# Patient Record
Sex: Male | Born: 1980 | Race: Black or African American | Hispanic: No | Marital: Single | Smoking: Former smoker
Health system: Southern US, Community
[De-identification: ages and names within clinical notes are randomized; demographics above are authoritative.]

## PROBLEM LIST (undated history)

## (undated) DIAGNOSIS — M5136 Other intervertebral disc degeneration, lumbar region: Secondary | ICD-10-CM

## (undated) DIAGNOSIS — M5137 Other intervertebral disc degeneration, lumbosacral region: Secondary | ICD-10-CM

## (undated) DIAGNOSIS — M25519 Pain in unspecified shoulder: Secondary | ICD-10-CM

## (undated) DIAGNOSIS — S63591A Other specified sprain of right wrist, initial encounter: Secondary | ICD-10-CM

## (undated) DIAGNOSIS — M51379 Other intervertebral disc degeneration, lumbosacral region without mention of lumbar back pain or lower extremity pain: Secondary | ICD-10-CM

## (undated) DIAGNOSIS — K219 Gastro-esophageal reflux disease without esophagitis: Secondary | ICD-10-CM

## (undated) DIAGNOSIS — M51369 Other intervertebral disc degeneration, lumbar region without mention of lumbar back pain or lower extremity pain: Secondary | ICD-10-CM

## (undated) DIAGNOSIS — M5126 Other intervertebral disc displacement, lumbar region: Secondary | ICD-10-CM

## (undated) DIAGNOSIS — M549 Dorsalgia, unspecified: Secondary | ICD-10-CM

## (undated) HISTORY — PX: NO PAST SURGERIES: SHX2092

---

## 2016-04-21 ENCOUNTER — Encounter (HOSPITAL_COMMUNITY): Payer: Self-pay | Admitting: Emergency Medicine

## 2016-04-21 DIAGNOSIS — Z5321 Procedure and treatment not carried out due to patient leaving prior to being seen by health care provider: Secondary | ICD-10-CM | POA: Diagnosis not present

## 2016-04-21 DIAGNOSIS — N281 Cyst of kidney, acquired: Secondary | ICD-10-CM | POA: Insufficient documentation
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
## 2016-04-22 ENCOUNTER — Emergency Department (HOSPITAL_COMMUNITY)
Admission: EM | Admit: 2016-04-22 | Discharge: 2016-04-22 | Disposition: A | Discharge status: Home / Self Care | Payer: Managed Care, Other (non HMO) | Attending: Dermatology | Admitting: Dermatology

## 2016-04-22 HISTORY — DX: Other intervertebral disc degeneration, lumbar region without mention of lumbar back pain or lower extremity pain: M51.369

## 2016-04-22 HISTORY — DX: Other intervertebral disc degeneration, lumbar region: M51.36

## 2016-04-22 HISTORY — DX: Pain in unspecified shoulder: M25.519

## 2016-04-22 HISTORY — DX: Other intervertebral disc displacement, lumbar region: M51.26

## 2016-04-22 NOTE — ED Notes (Signed)
Pt called for vitals, no answer x2 

## 2016-04-22 NOTE — ED Notes (Signed)
Pt called for room with no answer. 

## 2016-04-23 ENCOUNTER — Emergency Department (HOSPITAL_COMMUNITY)
Admission: EM | Admit: 2016-04-23 | Discharge: 2016-04-24 | Disposition: A | Discharge status: Home / Self Care | Payer: No Typology Code available for payment source | Attending: Emergency Medicine | Admitting: Emergency Medicine

## 2016-04-23 ENCOUNTER — Encounter (HOSPITAL_COMMUNITY): Payer: Self-pay | Admitting: Emergency Medicine

## 2016-04-23 ENCOUNTER — Emergency Department (HOSPITAL_COMMUNITY): Payer: No Typology Code available for payment source

## 2016-04-23 DIAGNOSIS — R0789 Other chest pain: Secondary | ICD-10-CM | POA: Diagnosis present

## 2016-04-23 LAB — CBC
HEMATOCRIT: 43.2 % (ref 39.0–52.0)
HEMOGLOBIN: 14.6 g/dL (ref 13.0–17.0)
MCH: 29.6 pg (ref 26.0–34.0)
MCHC: 33.8 g/dL (ref 30.0–36.0)
MCV: 87.4 fL (ref 78.0–100.0)
Platelets: 158 10*3/uL (ref 150–400)
RBC: 4.94 MIL/uL (ref 4.22–5.81)
RDW: 13.4 % (ref 11.5–15.5)
WBC: 7.1 10*3/uL (ref 4.0–10.5)

## 2016-04-23 LAB — BASIC METABOLIC PANEL
ANION GAP: 6 (ref 5–15)
BUN: 11 mg/dL (ref 6–20)
CALCIUM: 9.4 mg/dL (ref 8.9–10.3)
CO2: 27 mmol/L (ref 22–32)
Chloride: 106 mmol/L (ref 101–111)
Creatinine, Ser: 1.08 mg/dL (ref 0.61–1.24)
GFR calc non Af Amer: >60 mL/min (ref >60)
GLUCOSE: 90 mg/dL (ref 65–99)
POTASSIUM: 3.8 mmol/L (ref 3.5–5.1)
Sodium: 139 mmol/L (ref 135–145)

## 2016-04-23 LAB — I-STAT TROPONIN, ED: TROPONIN I, POC: 0 ng/mL (ref 0.00–0.08)

## 2016-04-23 NOTE — ED Triage Notes (Signed)
Pt from home with complaints of left chest pain. Pt states he was seen at an UC earlier today for the same. Pt states he drank soda and his symptoms became worse. Pt has clear lung sounds and normal heart sounds. Pt has adequate cap refill and strong radial pulses. Pt denies radiation to any other body part. Pt is not diaphoretic or lightheaded.

## 2016-04-24 MED ORDER — IBUPROFEN 800 MG PO TABS: 800.0000 mg | ORAL_TABLET | Freq: Three times a day (TID) | ORAL | 0 refills | Status: DC

## 2016-04-24 NOTE — ED Provider Notes (Signed)
[facility]-EMERGENCY DEPT Provider Note   CSN: [id number] Arrival date & time: 04/23/16  2206  By signing my name below, I, Majel Homer, attest that this documentation has been prepared under the direction and in the presence of Pricilla Loveless, MD . Electronically Signed: Majel Homer, Scribe. 04/24/2016. 12:24 AM.  History   Chief Complaint Chief Complaint  Patient presents with  . Chest Pain   The history is provided by the patient. No language interpreter was used.   HPI Comments: Chad Nichols is a 35 y.o. male who presents to the Emergency Department complaining of gradually worsening, intermittent, "sharp," central chest pain described as tightness that began yesterday morning. Pt reports his pain lasts for 15-20 minutes during episodes; he denies pain now in the ED. He states his pain is exacerbated when breathing deeply. He notes he has not taken any medication to relieve his pain. Pt denies shortness of breath, diaphoresis, nausea, vomiting, leg swelling and hemataemesis. He states he was recently prescribed hydrocodone and lyrica for a bulging lumbar disc. Pt reports he has a follow-up appointment with his PCP next week.   PCP: [facility]  Past Medical History:  Diagnosis Date  . Bulging lumbar disc   . Shoulder pain    There are no active problems to display for this patient.  History reviewed. No pertinent surgical history.  Home Medications    Prior to Admission medications   Medication Sig Start Date End Date Taking? Authorizing Provider  ibuprofen (ADVIL,MOTRIN) 800 MG tablet Take 1 tablet (800 mg total) by mouth 3 (three) times daily. 04/24/16   Pricilla Loveless, MD    Family History No family history on file.  Social History Social History  Substance Use Topics  . Smoking status: Never Smoker  . Smokeless tobacco: Never Used  . Alcohol use Yes   Allergies   Review of patient's allergies indicates no known allergies.   Review of  Systems Review of Systems  Constitutional: Negative for diaphoresis.  Respiratory: Positive for chest tightness. Negative for shortness of breath.   Cardiovascular: Positive for chest pain. Negative for leg swelling.  Gastrointestinal: Negative for nausea and vomiting.   Physical Exam Updated Vital Signs BP 117/84 (BP Location: Right Arm)   Pulse 64   Temp 98.3 F (36.8 C) (Oral)   Resp 16   Ht 6\' 8"  (2.032 m)   Wt 247 lb (112 kg)   SpO2 98%   BMI 27.13 kg/m   Physical Exam  Constitutional: He is oriented to person, place, and time. He appears well-developed and well-nourished.  HENT:  Head: Normocephalic and atraumatic.  Right Ear: External ear normal.  Left Ear: External ear normal.  Nose: Nose normal.  Eyes: Right eye exhibits no discharge. Left eye exhibits no discharge.  Neck: Neck supple.  Cardiovascular: Normal rate, regular rhythm and normal heart sounds.   Pulmonary/Chest: Effort normal and breath sounds normal. He exhibits tenderness (mild tenderness just left of sternum).  Abdominal: Soft. There is no tenderness.  Musculoskeletal: He exhibits no edema.  Neurological: He is alert and oriented to person, place, and time.  Skin: Skin is warm and dry.  Nursing note and vitals reviewed.  ED Treatments / Results  Labs (all labs ordered are listed, but only abnormal results are displayed) Labs Reviewed  BASIC METABOLIC PANEL  CBC  I-STAT TROPOININ, ED    EKG  EKG Interpretation  Date/Time:  Thursday April 23 2016 22:14:22 EDT Ventricular Rate:  63 PR Interval:  QRS Duration: 96 QT Interval:  382 QTC Calculation: 391 R Axis:   36 Text Interpretation:  Sinus rhythm Borderline low voltage, extremity leads RSR' in V1 or V2, probably normal variant ST elevation suggests acute pericarditis no reciprocal changes No old tracing to compare Confirmed by Shakedra Beam MD, Necha Harries [phone]([id number]) on 04/23/2016 11:54:04 PM       Radiology Dg Chest 2 View  Result Date:  04/23/2016 CLINICAL DATA:  Upper left chest pain radiating to the left shoulder since this morning. Nonsmoker. EXAM: CHEST  2 VIEW COMPARISON:  None. FINDINGS: Mild hyperinflation. The heart size and mediastinal contours are within normal limits. Both lungs are clear. The visualized skeletal structures are unremarkable. IMPRESSION: No active cardiopulmonary disease. Electronically Signed   By: Burman NievesWilliam  Stevens M.D.   On: 04/23/2016 23:23    Procedures Procedures (including critical care time)  Medications Ordered in ED Medications - No data to display  DIAGNOSTIC STUDIES:  Oxygen Saturation is 98% on RA, normal by my interpretation.    COORDINATION OF CARE:  12:22 AM Discussed treatment plan with pt at bedside and pt agreed to plan.  Initial Impression / Assessment and Plan / ED Course  I have reviewed the triage vital signs and the nursing notes.  Pertinent labs & imaging results that were available during my care of the patient were reviewed by me and considered in my medical decision making (see chart for details).  Clinical Course    Patient's pain seems MSK vs mild pericarditis. CXR benign. Labs, troponin negative after 48 hours of symptoms. Highly doubt PE, no risk factors, no tachycardia, hypoxia. Ibuprofen x 1 week, f/u with PCP.  I personally performed the services described in this documentation, which was scribed in my presence. The recorded information has been reviewed and is accurate.   Final Clinical Impressions(s) / ED Diagnoses   Final diagnoses:  Chest wall pain    New Prescriptions New Prescriptions   IBUPROFEN (ADVIL,MOTRIN) 800 MG TABLET    Take 1 tablet (800 mg total) by mouth 3 (three) times daily.     Pricilla LovelessScott Arihant Pennings, MD 04/24/16 [phone]

## 2016-10-03 ENCOUNTER — Emergency Department (HOSPITAL_COMMUNITY)
Admission: EM | Admit: 2016-10-03 | Discharge: 2016-10-03 | Disposition: A | Discharge status: Home / Self Care | Payer: Managed Care, Other (non HMO) | Attending: Emergency Medicine | Admitting: Emergency Medicine

## 2016-10-03 ENCOUNTER — Encounter (HOSPITAL_COMMUNITY): Payer: Self-pay | Admitting: Emergency Medicine

## 2016-10-03 DIAGNOSIS — R220 Localized swelling, mass and lump, head: Secondary | ICD-10-CM | POA: Diagnosis present

## 2016-10-03 DIAGNOSIS — M270 Developmental disorders of jaws: Secondary | ICD-10-CM | POA: Diagnosis not present

## 2016-10-03 DIAGNOSIS — Z79899 Other long term (current) drug therapy: Secondary | ICD-10-CM | POA: Diagnosis not present

## 2016-10-03 MED ORDER — IBUPROFEN 800 MG PO TABS
800.0000 mg | ORAL_TABLET | Freq: Once | ORAL | Status: AC
  Administered 2016-10-03: 800 mg via ORAL
  Filled 2016-10-03: qty 1

## 2016-10-03 NOTE — ED Triage Notes (Signed)
Per pt, states he noticed his upper palate swollen-went to dentist and was given mouth wash-states it has not helped-hard to touch, some discomfort with palpation

## 2016-10-03 NOTE — ED Provider Notes (Signed)
[facility]-EMERGENCY DEPT Provider Note   CSN: [id number] Arrival date & time: 10/03/16  1738  By signing my name below, I, Sonum Patel, attest that this documentation has been prepared under the direction and in the presence of [employer], PA-C. Electronically Signed: Sonum Patel, Neurosurgeon. 10/03/16. 6:42 PM.  History   Chief Complaint Chief Complaint  Patient presents with  . dental issue    The history is provided by the patient. No language interpreter was used.     HPI Comments: Chad Nichols is a 36 y.o. male who presents to the Emergency Department complaining of an area of swelling with associated hardness and mild discomfort to the hard palate that he noticed over the last week. He was seen by his dentist for a filling and was given a mouthwash to use for these symptoms but states it was not helpful. He denies any modifying factors. He denies any other complaints at this time.   Past Medical History:  Diagnosis Date  . Bulging lumbar disc   . Shoulder pain     There are no active problems to display for this patient.   History reviewed. No pertinent surgical history.     Home Medications    Prior to Admission medications   Medication Sig Start Date End Date Taking? Authorizing Provider  ibuprofen (ADVIL,MOTRIN) 800 MG tablet Take 1 tablet (800 mg total) by mouth 3 (three) times daily. 04/24/16   Pricilla Loveless, MD    Family History No family history on file.  Social History Social History  Substance Use Topics  . Smoking status: Never Smoker  . Smokeless tobacco: Never Used  . Alcohol use Yes     Allergies   Patient has no known allergies.   Review of Systems Review of Systems  Constitutional: Negative for fever.  HENT: Negative for dental problem.        +swelling and discomfort to hard palate     Physical Exam Updated Vital Signs BP 140/90   Pulse 79   Temp 98.3 F (36.8 C) (Oral)   Resp 18   SpO2 100%   Physical Exam    Constitutional: He is oriented to person, place, and time. He appears well-developed and well-nourished.  HENT:  Head: Normocephalic and atraumatic.  Cardiovascular: Normal rate.   Pulmonary/Chest: Effort normal.  Neurological: He is alert and oriented to person, place, and time.  Skin: Skin is warm and dry.  Psychiatric: He has a normal mood and affect.  Nursing note and vitals reviewed.    ED Treatments / Results  DIAGNOSTIC STUDIES: Oxygen Saturation is 100% on RA, normal by my interpretation.    COORDINATION OF CARE: 6:34 PM Discussed treatment plan with pt at bedside and pt agreed to plan.   Labs (all labs ordered are listed, but only abnormal results are displayed) Labs Reviewed - No data to display  EKG  EKG Interpretation None       Radiology No results found.  Procedures Procedures (including critical care time)  Medications Ordered in ED Medications - No data to display   Initial Impression / Assessment and Plan / ED Course  I have reviewed the triage vital signs and the nursing notes.  Pertinent labs & imaging results that were available during my care of the patient were reviewed by me and considered in my medical decision making (see chart for details).      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  Patient only most recently noticing this.  This appears like a classical presentation, uniformly midline with no other abnormalities noted.  Patient is artery been seen by his dentist for this twice, encouraged him to follow-up with dentist or oral surgeon for repeat evaluation to confirm that this diagnosis is correct and this is not any other concerning finding.  Patient verbalized he will follow-up as indicated.  No further questions or concerns at time of discharge  New Prescriptions New Prescriptions   No medications on file   I personally  performed the services described in this documentation, which was scribed in my presence. The recorded information has been reviewed and is accurate.    Eyvonne MechanicJeffrey Kandise Riehle, PA-C 10/03/16 1842    Charlynne Panderavid Hsienta Yao, MD 10/03/16 [phone]

## 2016-10-03 NOTE — Discharge Instructions (Signed)
Your presentation today is most consistent with torus palatinus.  I encourage you to follow up with your dentist or any oral surgeon for repeat evaluation and management of this to rule out any significant other causes.

## 2018-09-15 ENCOUNTER — Other Ambulatory Visit: Payer: Self-pay

## 2018-09-15 ENCOUNTER — Emergency Department (HOSPITAL_BASED_OUTPATIENT_CLINIC_OR_DEPARTMENT_OTHER): Payer: No Typology Code available for payment source

## 2018-09-15 ENCOUNTER — Emergency Department (HOSPITAL_BASED_OUTPATIENT_CLINIC_OR_DEPARTMENT_OTHER)
Admission: EM | Admit: 2018-09-15 | Discharge: 2018-09-15 | Disposition: A | Discharge status: Home / Self Care | Payer: No Typology Code available for payment source | Attending: Emergency Medicine | Admitting: Emergency Medicine

## 2018-09-15 ENCOUNTER — Encounter (HOSPITAL_BASED_OUTPATIENT_CLINIC_OR_DEPARTMENT_OTHER): Payer: Self-pay

## 2018-09-15 DIAGNOSIS — Z79899 Other long term (current) drug therapy: Secondary | ICD-10-CM | POA: Diagnosis not present

## 2018-09-15 DIAGNOSIS — R51 Headache: Secondary | ICD-10-CM | POA: Diagnosis present

## 2018-09-15 DIAGNOSIS — G44209 Tension-type headache, unspecified, not intractable: Secondary | ICD-10-CM | POA: Insufficient documentation

## 2018-09-15 DIAGNOSIS — M545 Low back pain, unspecified: Secondary | ICD-10-CM

## 2018-09-15 DIAGNOSIS — Z87891 Personal history of nicotine dependence: Secondary | ICD-10-CM | POA: Insufficient documentation

## 2018-09-15 DIAGNOSIS — R519 Headache, unspecified: Secondary | ICD-10-CM

## 2018-09-15 HISTORY — DX: Dorsalgia, unspecified: M54.9

## 2018-09-15 MED ORDER — NAPROXEN 250 MG PO TABS
500.0000 mg | ORAL_TABLET | Freq: Once | ORAL | Status: AC
  Administered 2018-09-15: 500 mg via ORAL
  Filled 2018-09-15: qty 2

## 2018-09-15 MED ORDER — NAPROXEN 500 MG PO TABS: 500.0000 mg | ORAL_TABLET | Freq: Two times a day (BID) | ORAL | 0 refills | Status: DC

## 2018-09-15 MED ORDER — PREDNISONE 50 MG PO TABS
60.0000 mg | ORAL_TABLET | Freq: Once | ORAL | Status: AC
  Administered 2018-09-15: 60 mg via ORAL
  Filled 2018-09-15: qty 1

## 2018-09-15 MED ORDER — METHOCARBAMOL 500 MG PO TABS: 500.0000 mg | ORAL_TABLET | Freq: Two times a day (BID) | ORAL | 0 refills | Status: DC

## 2018-09-15 MED FILL — METHOCARBAMOL 500 MG TABLET: 500 | 10 days supply | Qty: 20 | Fill #0

## 2018-09-15 MED FILL — NAPROXEN 500 MG TABLET: 500 | 15 days supply | Qty: 30 | Fill #0

## 2018-09-15 NOTE — Discharge Instructions (Signed)
Take NSAIDs or Tylenol as needed for the next week. Take this medicine with food. °Take muscle relaxer at bedtime to help you sleep. This medicine makes you drowsy so do not take before driving or work °Use a heating pad for sore muscles - use for 20 minutes several times a day °Return for worsening symptoms ° °
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
## 2018-09-15 NOTE — ED Notes (Addendum)
PT states prior to [facility] he has been dealing with L5-S1 disc protruding and putting pressure on the nerves.

## 2018-09-22 ENCOUNTER — Other Ambulatory Visit: Payer: Self-pay

## 2018-09-22 ENCOUNTER — Encounter (HOSPITAL_BASED_OUTPATIENT_CLINIC_OR_DEPARTMENT_OTHER): Payer: Self-pay | Admitting: Emergency Medicine

## 2018-09-22 ENCOUNTER — Emergency Department (HOSPITAL_BASED_OUTPATIENT_CLINIC_OR_DEPARTMENT_OTHER)
Admission: EM | Admit: 2018-09-22 | Discharge: 2018-09-22 | Disposition: A | Discharge status: Home / Self Care | Payer: No Typology Code available for payment source | Attending: Emergency Medicine | Admitting: Emergency Medicine

## 2018-09-22 DIAGNOSIS — M62830 Muscle spasm of back: Secondary | ICD-10-CM | POA: Diagnosis not present

## 2018-09-22 DIAGNOSIS — L731 Pseudofolliculitis barbae: Secondary | ICD-10-CM

## 2018-09-22 DIAGNOSIS — M549 Dorsalgia, unspecified: Secondary | ICD-10-CM | POA: Diagnosis present

## 2018-09-22 DIAGNOSIS — Z87891 Personal history of nicotine dependence: Secondary | ICD-10-CM | POA: Insufficient documentation

## 2018-09-22 MED ORDER — METHOCARBAMOL 500 MG PO TABS: 500.0000 mg | ORAL_TABLET | Freq: Two times a day (BID) | ORAL | 0 refills | Status: DC

## 2018-09-22 MED ORDER — MELOXICAM 7.5 MG PO TABS: 7.5000 mg | ORAL_TABLET | Freq: Every day | ORAL | 0 refills | Status: AC

## 2018-09-22 MED ORDER — KETOROLAC TROMETHAMINE 15 MG/ML IJ SOLN
15.0000 mg | Freq: Once | INTRAMUSCULAR | Status: AC
  Administered 2018-09-22: 15 mg via INTRAMUSCULAR
  Filled 2018-09-22: qty 1

## 2018-09-22 NOTE — Discharge Instructions (Addendum)
Warm compresses to sore muscles.  Gentle range of motion exercise. Discontinue the naproxen.  Take meloxicam and Mobic as needed as prescribed. Follow-up with your orthopedic or PCP.
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
## 2018-10-19 ENCOUNTER — Encounter (HOSPITAL_BASED_OUTPATIENT_CLINIC_OR_DEPARTMENT_OTHER): Payer: Self-pay

## 2018-10-19 ENCOUNTER — Other Ambulatory Visit: Payer: Self-pay

## 2018-10-19 ENCOUNTER — Emergency Department (HOSPITAL_BASED_OUTPATIENT_CLINIC_OR_DEPARTMENT_OTHER)
Admission: EM | Admit: 2018-10-19 | Discharge: 2018-10-19 | Disposition: A | Discharge status: Home / Self Care | Payer: 59 | Attending: Emergency Medicine | Admitting: Emergency Medicine

## 2018-10-19 DIAGNOSIS — R040 Epistaxis: Secondary | ICD-10-CM | POA: Insufficient documentation

## 2018-10-19 DIAGNOSIS — Z87891 Personal history of nicotine dependence: Secondary | ICD-10-CM | POA: Insufficient documentation

## 2018-10-19 DIAGNOSIS — Z79899 Other long term (current) drug therapy: Secondary | ICD-10-CM | POA: Insufficient documentation

## 2018-10-19 NOTE — ED Provider Notes (Signed)
[facility] EMERGENCY DEPARTMENT Provider Note   CSN: [id number] Arrival date & time: 10/19/18  2014    History   Chief Complaint Chief Complaint  Patient presents with  . Epistaxis    HPI Chad Nichols is a 38 y.o. male.     38 y.o male with no PMH presents to the ED with a chief complaint of epistaxis x few weeks.  Patient reports he is experience nosebleeds every morning, this happens after he blows his nose.  Patient was diagnosed with a sinus infection previously, reports he was placed on antibiotics, the nosebleeds began after.  He also reports having a nosebleed prior to arrival during coitus, he reports his nose began bleeding on top of his partner.  He has not tried any medical therapy for relieving symptoms.  He has not been seen by any specialist.  He denies any blood thinner use, shortness of breath, difficulty breathing.     Past Medical History:  Diagnosis Date  . Back pain   . Bulging lumbar disc   . Shoulder pain     There are no active problems to display for this patient.   History reviewed. No pertinent surgical history.      Home Medications    Prior to Admission medications   Medication Sig Start Date End Date Taking? Authorizing Provider  methocarbamol (ROBAXIN) 500 MG tablet Take 1 tablet (500 mg total) by mouth 2 (two) times daily. 09/22/18   Jeannie Fend, PA-C    Family History No family history on file.  Social History Social History   Tobacco Use  . Smoking status: Former Games developer  . Smokeless tobacco: Never Used  Substance Use Topics  . Alcohol use: Yes    Comment: occ  . Drug use: Never     Allergies   Patient has no known allergies.   Review of Systems Review of Systems  Constitutional: Negative for fever.  HENT: Positive for nosebleeds (None at present.).      Physical Exam Updated Vital Signs BP 127/84 (BP Location: Left Arm)   Pulse 76   Temp 98.1 F (36.7 C) (Oral)   Resp 18   Ht  6\' 8"  (2.032 m)   Wt 122.7 kg   SpO2 98%   BMI 29.72 kg/m   Physical Exam Vitals signs and nursing note reviewed.  Constitutional:      Appearance: He is well-developed.  HENT:     Head: Normocephalic and atraumatic.     Nose: Congestion present.     Right Nostril: Epistaxis (Not actively.  Controlled.  Small superficial vessel torn seen on exam.) present.     Right Turbinates: Enlarged.     Left Turbinates: Enlarged.     Right Sinus: Maxillary sinus tenderness present. No frontal sinus tenderness.     Left Sinus: No frontal sinus tenderness.  Eyes:     General: No scleral icterus.    Pupils: Pupils are equal, round, and reactive to light.  Neck:     Musculoskeletal: Normal range of motion.  Cardiovascular:     Heart sounds: Normal heart sounds.  Pulmonary:     Effort: Pulmonary effort is normal.     Breath sounds: Normal breath sounds. No wheezing.  Chest:     Chest wall: No tenderness.  Abdominal:     General: Bowel sounds are normal. There is no distension.     Palpations: Abdomen is soft.     Tenderness: There is no abdominal tenderness.  Musculoskeletal:        General: No tenderness or deformity.  Skin:    General: Skin is warm and dry.  Neurological:     Mental Status: He is alert and oriented to person, place, and time.      ED Treatments / Results  Labs (all labs ordered are listed, but only abnormal results are displayed) Labs Reviewed - No data to display  EKG None  Radiology No results found.  Procedures Procedures (including critical care time)  Medications Ordered in ED Medications - No data to display   Initial Impression / Assessment and Plan / ED Course  I have reviewed the triage vital signs and the nursing notes.  Pertinent labs & imaging results that were available during my care of the patient were reviewed by me and considered in my medical decision making (see chart for details).       Patient presents with chief complaint  of recurrent nosebleed, reports he was diagnosed with a sinus infection several weeks ago, given antibiotics.  Reports his symptoms have improved however he begins having nosebleeds after blowing his nose every morning. During evaluation there is no active bleeding, small vessel noted to the right nostril, patient's vital signs are stable.  He reports no dizziness, not on any blood thinner medication at this time.  Will have patient follow-up with ENT as it is a recurrent complaint.  Vital signs are stable, patient with stable vitals discharge from the ED.  Final Clinical Impressions(s) / ED Diagnoses   Final diagnoses:  Right-sided epistaxis    ED Discharge Orders    None       Claude Manges, Cordelia Poche 10/19/18 2041    Maia Plan, MD 10/20/18 [phone]

## 2018-10-19 NOTE — ED Triage Notes (Signed)
Pt c/o nosebleed for two minutes a half an hour ago, no current bleeding, has nasal congestion

## 2018-10-19 NOTE — Discharge Instructions (Signed)
I have provided the number to 2 ENT physicians, please schedule an appointment with them to further evaluate your recurrent nosebleeds.  Please note both of these physicians are linked to a medical group, you may see anyone else in the group as well.

## 2019-06-10 ENCOUNTER — Encounter (HOSPITAL_BASED_OUTPATIENT_CLINIC_OR_DEPARTMENT_OTHER): Payer: Self-pay | Admitting: Emergency Medicine

## 2019-06-10 ENCOUNTER — Emergency Department (HOSPITAL_BASED_OUTPATIENT_CLINIC_OR_DEPARTMENT_OTHER)
Admission: EM | Admit: 2019-06-10 | Discharge: 2019-06-10 | Disposition: A | Discharge status: Home / Self Care | Payer: 59 | Attending: Emergency Medicine | Admitting: Emergency Medicine

## 2019-06-10 ENCOUNTER — Other Ambulatory Visit: Payer: Self-pay

## 2019-06-10 DIAGNOSIS — Y999 Unspecified external cause status: Secondary | ICD-10-CM | POA: Insufficient documentation

## 2019-06-10 DIAGNOSIS — Z87891 Personal history of nicotine dependence: Secondary | ICD-10-CM | POA: Insufficient documentation

## 2019-06-10 DIAGNOSIS — S39012A Strain of muscle, fascia and tendon of lower back, initial encounter: Secondary | ICD-10-CM | POA: Diagnosis not present

## 2019-06-10 DIAGNOSIS — S46911A Strain of unspecified muscle, fascia and tendon at shoulder and upper arm level, right arm, initial encounter: Secondary | ICD-10-CM | POA: Insufficient documentation

## 2019-06-10 DIAGNOSIS — Y92410 Unspecified street and highway as the place of occurrence of the external cause: Secondary | ICD-10-CM | POA: Insufficient documentation

## 2019-06-10 DIAGNOSIS — S3992XA Unspecified injury of lower back, initial encounter: Secondary | ICD-10-CM | POA: Diagnosis present

## 2019-06-10 DIAGNOSIS — T148XXA Other injury of unspecified body region, initial encounter: Secondary | ICD-10-CM

## 2019-06-10 DIAGNOSIS — Y9389 Activity, other specified: Secondary | ICD-10-CM | POA: Diagnosis not present

## 2019-06-10 MED ORDER — MELOXICAM 15 MG PO TABS: 15.0000 mg | ORAL_TABLET | Freq: Every day | ORAL | 0 refills | Status: DC

## 2019-06-10 MED ORDER — DEXAMETHASONE 4 MG PO TABS: 4.0000 mg | ORAL_TABLET | Freq: Two times a day (BID) | ORAL | 0 refills | Status: DC
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
## 2019-06-22 ENCOUNTER — Emergency Department (HOSPITAL_BASED_OUTPATIENT_CLINIC_OR_DEPARTMENT_OTHER): Payer: 59

## 2019-06-22 ENCOUNTER — Encounter (HOSPITAL_BASED_OUTPATIENT_CLINIC_OR_DEPARTMENT_OTHER): Payer: Self-pay

## 2019-06-22 ENCOUNTER — Other Ambulatory Visit: Payer: Self-pay

## 2019-06-22 ENCOUNTER — Emergency Department (HOSPITAL_BASED_OUTPATIENT_CLINIC_OR_DEPARTMENT_OTHER)
Admission: EM | Admit: 2019-06-22 | Discharge: 2019-06-22 | Disposition: A | Discharge status: Home / Self Care | Payer: 59 | Attending: Emergency Medicine | Admitting: Emergency Medicine

## 2019-06-22 DIAGNOSIS — R1032 Left lower quadrant pain: Secondary | ICD-10-CM | POA: Diagnosis not present

## 2019-06-22 DIAGNOSIS — R05 Cough: Secondary | ICD-10-CM

## 2019-06-22 DIAGNOSIS — Z87891 Personal history of nicotine dependence: Secondary | ICD-10-CM | POA: Diagnosis not present

## 2019-06-22 DIAGNOSIS — R350 Frequency of micturition: Secondary | ICD-10-CM | POA: Diagnosis not present

## 2019-06-22 DIAGNOSIS — R059 Cough, unspecified: Secondary | ICD-10-CM

## 2019-06-22 DIAGNOSIS — R1031 Right lower quadrant pain: Secondary | ICD-10-CM | POA: Diagnosis present

## 2019-06-22 DIAGNOSIS — R109 Unspecified abdominal pain: Secondary | ICD-10-CM

## 2019-06-22 DIAGNOSIS — Z888 Allergy status to other drugs, medicaments and biological substances status: Secondary | ICD-10-CM | POA: Insufficient documentation

## 2019-06-22 LAB — CBC WITH DIFFERENTIAL/PLATELET
Abs Immature Granulocytes: 0.01 10*3/uL (ref 0.00–0.07)
Basophils Absolute: 0.1 10*3/uL (ref 0.0–0.1)
Basophils Relative: 1 %
Eosinophils Absolute: 0.4 10*3/uL (ref 0.0–0.5)
Eosinophils Relative: 5 %
HCT: 43.8 % (ref 39.0–52.0)
Hemoglobin: 14.4 g/dL (ref 13.0–17.0)
Immature Granulocytes: 0 %
Lymphocytes Relative: 41 %
Lymphs Abs: 3.1 10*3/uL (ref 0.7–4.0)
MCH: 29.2 pg (ref 26.0–34.0)
MCHC: 32.9 g/dL (ref 30.0–36.0)
MCV: 88.8 fL (ref 80.0–100.0)
Monocytes Absolute: 0.6 10*3/uL (ref 0.1–1.0)
Monocytes Relative: 8 %
Neutro Abs: 3.4 10*3/uL (ref 1.7–7.7)
Neutrophils Relative %: 45 %
Platelets: 169 10*3/uL (ref 150–400)
RBC: 4.93 MIL/uL (ref 4.22–5.81)
RDW: 13.5 % (ref 11.5–15.5)
Smear Review: NORMAL
WBC: 7.6 10*3/uL (ref 4.0–10.5)
nRBC: 0 % (ref 0.0–0.2)

## 2019-06-22 LAB — URINALYSIS, ROUTINE W REFLEX MICROSCOPIC
Bilirubin Urine: NEGATIVE
Glucose, UA: NEGATIVE mg/dL
Hgb urine dipstick: NEGATIVE
Ketones, ur: NEGATIVE mg/dL
Leukocytes,Ua: NEGATIVE
Nitrite: NEGATIVE
Protein, ur: NEGATIVE mg/dL
Specific Gravity, Urine: 1.02 (ref 1.005–1.030)
pH: 6 (ref 5.0–8.0)

## 2019-06-22 LAB — BASIC METABOLIC PANEL
Anion gap: 7 (ref 5–15)
BUN: 15 mg/dL (ref 6–20)
CO2: 24 mmol/L (ref 22–32)
Calcium: 8.8 mg/dL — ABNORMAL LOW (ref 8.9–10.3)
Chloride: 107 mmol/L (ref 98–111)
Creatinine, Ser: 1.02 mg/dL (ref 0.61–1.24)
GFR calc Af Amer: >60 mL/min (ref >60)
GFR calc non Af Amer: >60 mL/min (ref >60)
Glucose, Bld: 132 mg/dL — ABNORMAL HIGH (ref 70–99)
Potassium: 3.4 mmol/L — ABNORMAL LOW (ref 3.5–5.1)
Sodium: 138 mmol/L (ref 135–145)

## 2019-06-22 MED ORDER — SODIUM CHLORIDE 0.9 % IV BOLUS
1000.0000 mL | Freq: Once | INTRAVENOUS | Status: AC
  Administered 2019-06-22: 1000 mL via INTRAVENOUS

## 2019-06-22 MED ORDER — KETOROLAC TROMETHAMINE 30 MG/ML IJ SOLN
30.0000 mg | Freq: Once | INTRAMUSCULAR | Status: DC
  Filled 2019-06-22: qty 1

## 2019-06-22 NOTE — ED Notes (Signed)
ED Provider at bedside. 

## 2019-06-22 NOTE — Discharge Instructions (Signed)
Your CT scan was reassuring today and does not show any evidence of kidney stones or problems with your kidneys, your lab work showed normal kidney function and urine does not look infected.  We sent off STD testing and you will be called in 2 to 3 days if any results are positive, if negative you will not receive a call back but can check your results online through my chart.  Your chest x-ray showed no evidence of pneumonia.  If you continue to experience the symptoms please follow-up with urology.  I think that you are likely having some degree of musculoskeletal back pain, use ibuprofen, Tylenol, ice and heat and over-the-counter muscle rubs or lidocaine patches.  Return for any new or worsening symptoms.

## 2019-06-22 NOTE — ED Notes (Signed)
Patient transported to CT 

## 2019-06-22 NOTE — ED Triage Notes (Addendum)
Pt c/o bilat flank pain and urinary freq x 3-4 days-NAD-steady gait-pt later added c/o prod cough x 1 week

## 2019-06-22 NOTE — ED Provider Notes (Addendum)
[facility] EMERGENCY DEPARTMENT Provider Note   CSN: [id number] Arrival date & time: 06/22/19  1651     History   Chief Complaint Chief Complaint  Patient presents with   Flank Pain   Cough    HPI Chad Nichols is a 38 y.o. male.     Chad Nichols is a 38 y.o. male with history of back pain, who presents to the ED for evaluation of bilateral flank pain and reported urinary frequency over the past 3 to 4 days.  Patient also adds that he has had an intermittent cough over the past week.  Patient reports that he feels that he needs to urinate frequently.  He states that he sometimes has difficulty initiating his urinary stream and then feels like he is dribbling afterwards.  He reports that he has a tingling sensation in the penis sometimes after urination.  He denies any scrotal pain or swelling and no masses noted.  He reports that after urination he intermittently has bilateral flank pain.  He describes this as a sharp pain.  He denies any associated nausea or vomiting.  No fevers or chills.  Patient has not had any penile discharge but would like to be tested for gonorrhea and chlamydia.  He has not noted any genital lesions.  Patient reports an occasional nonproductive cough over the past week.  He denies chest pain or shortness of breath.  Has not had any fevers.  Denies any sick contacts.  Patient reports that he was recently tested for COVID-19 and would not like to be tested again today.     Past Medical History:  Diagnosis Date   Back pain    Bulging lumbar disc    Shoulder pain     There are no active problems to display for this patient.   History reviewed. No pertinent surgical history.      Home Medications    Prior to Admission medications   Medication Sig Start Date End Date Taking? Authorizing Provider  dexamethasone (DECADRON) 4 MG tablet Take 1 tablet (4 mg total) by mouth 2 (two) times daily. 06/10/19   Raeford Razor, MD  meloxicam (MOBIC) 15 MG tablet Take 1 tablet (15 mg total) by mouth daily. 06/10/19   Raeford Razor, MD  methocarbamol (ROBAXIN) 500 MG tablet Take 1 tablet (500 mg total) by mouth 2 (two) times daily. 09/22/18   Jeannie Fend, PA-C    Family History No family history on file.  Social History Social History   Tobacco Use   Smoking status: Former Smoker   Smokeless tobacco: Never Used  Substance Use Topics   Alcohol use: Yes    Comment: occ   Drug use: Never     Allergies   Pregabalin   Review of Systems Review of Systems  Constitutional: Negative for chills and fever.  HENT: Negative.   Respiratory: Positive for cough. Negative for shortness of breath.   Cardiovascular: Negative for chest pain.  Gastrointestinal: Negative for abdominal pain, constipation, diarrhea, nausea and vomiting.  Genitourinary: Positive for flank pain and frequency. Negative for difficulty urinating, discharge, dysuria, genital sores, hematuria, penile pain, scrotal swelling and testicular pain.  Musculoskeletal: Positive for back pain. Negative for arthralgias.  Skin: Negative for color change and rash.  Neurological: Negative for weakness and numbness.     Physical Exam Updated Vital Signs BP 134/78 (BP Location: Right Arm)    Pulse 75    Temp 98.5 F (36.9 C) (Oral)  Resp 18    Ht 6\' 8"  (2.032 m)    Wt 119.7 kg    SpO2 98%    BMI 29.00 kg/m   Physical Exam Vitals signs and nursing note reviewed.  Constitutional:      General: He is not in acute distress.    Appearance: Normal appearance. He is well-developed and normal weight. He is not ill-appearing or diaphoretic.  HENT:     Head: Normocephalic and atraumatic.  Eyes:     General:        Right eye: No discharge.        Left eye: No discharge.  Neck:     Musculoskeletal: Neck supple.  Cardiovascular:     Rate and Rhythm: Normal rate and regular rhythm.     Heart sounds: Normal heart sounds.  Pulmonary:      Effort: Pulmonary effort is normal. No respiratory distress.     Breath sounds: Normal breath sounds. No wheezing or rales.     Comments: Respirations equal and unlabored, patient able to speak in full sentences, lungs clear to auscultation bilaterally Abdominal:     General: Bowel sounds are normal. There is no distension.     Palpations: Abdomen is soft. There is no mass.     Tenderness: There is no abdominal tenderness. There is no guarding.     Comments: Abdomen soft, nondistended, nontender to palpation in all quadrants without guarding or peritoneal signs, patient does have some mild tenderness over bilateral flanks.  Musculoskeletal:        General: No deformity.     Comments: No midline spinal tenderness.  Skin:    General: Skin is warm and dry.     Capillary Refill: Capillary refill takes less than 2 seconds.  Neurological:     Mental Status: He is alert.     Coordination: Coordination normal.     Comments: Speech is clear, able to follow commands Moves extremities without ataxia, coordination intact  Psychiatric:        Mood and Affect: Mood normal.        Behavior: Behavior normal.      ED Treatments / Results  Labs (all labs ordered are listed, but only abnormal results are displayed) Labs Reviewed  BASIC METABOLIC PANEL - Abnormal; Notable for the following components:      Result Value   Potassium 3.4 (*)    Glucose, Bld 132 (*)    Calcium 8.8 (*)    All other components within normal limits  URINALYSIS, ROUTINE W REFLEX MICROSCOPIC  CBC WITH DIFFERENTIAL/PLATELET  GC/CHLAMYDIA PROBE AMP ([place]) NOT AT [facility]    EKG None  Radiology Dg Chest 2 View  Result Date: 06/22/2019 CLINICAL DATA:  Bilateral flank pain, cough EXAM: CHEST - 2 VIEW COMPARISON:  04/23/2016 FINDINGS: The heart size and mediastinal contours are within normal limits. Both lungs are clear. The visualized skeletal structures are unremarkable. IMPRESSION: No active cardiopulmonary  disease. Electronically Signed   By: Rolm Baptise M.D.   On: 06/22/2019 19:46   Ct Renal Stone Study  Result Date: 06/22/2019 CLINICAL DATA:  Flank pain, stone disease suspected, bilateral flank pain and urinary frequency for 3-4 days EXAM: CT ABDOMEN AND PELVIS WITHOUT CONTRAST TECHNIQUE: Multidetector CT imaging of the abdomen and pelvis was performed following the standard protocol without IV contrast. COMPARISON:  Lumbar MRI 10/24/2015 FINDINGS: Lower chest: Small amount of scarring or atelectasis in the medial right middle lobe. Lung bases otherwise clear. Normal heart size.  No pericardial effusion. Hepatobiliary: No focal liver abnormality is seen. Gallbladder largely decompressed. No visible gallstones, gallbladder wall thickening, or biliary dilatation. Pancreas: Unremarkable. No pancreatic ductal dilatation or surrounding inflammatory changes. Spleen: Normal in size without focal abnormality. Adrenals/Urinary Tract: Normal adrenal glands. Few subcentimeter hypoattenuating foci in both kidneys too small to fully characterize on CT imaging but statistically likely benign. No visible or contour deforming renal lesions. No significant perinephric stranding. No urolithiasis or hydronephrosis. Urinary bladder is grossly unremarkable. Stomach/Bowel: Distal esophagus, stomach and duodenal sweep are unremarkable. No small bowel wall thickening or dilatation. No evidence of obstruction. A normal appendix is visualized. No colonic dilatation or wall thickening. Scattered colonic diverticula without focal pericolonic inflammation to suggest diverticulitis. Vascular/Lymphatic: Few clustered, hazy mesenteric lymph nodes are noted in the left upper quadrant (2/36) with minimal surrounding fat stranding. No pathologically enlarged lymph nodes. The aorta is normal caliber. Reproductive: The prostate and seminal vesicles are unremarkable. Other: No abdominopelvic free fluid or free gas. No bowel containing hernias.  Small fat containing umbilical hernia. Musculoskeletal: No acute osseous abnormality or suspicious osseous lesion. IMPRESSION: 1. Few clustered, hazy mesenteric lymph nodes in the left upper quadrant with minimal surrounding fat stranding, suggestive of of an early or developing mesenteritis. 2. No urolithiasis or hydronephrosis. 3. Diverticulosis without evidence of acute diverticulitis. Electronically Signed   By: Kreg ShropshirePrice  DeHay M.D.   On: 06/22/2019 19:53    Procedures Procedures (including critical care time)  Medications Ordered in ED Medications  sodium chloride 0.9 % bolus 1,000 mL (0 mLs Intravenous Stopped 06/22/19 2012)     Initial Impression / Assessment and Plan / ED Course  I have reviewed the triage vital signs and the nursing notes.  Pertinent labs & imaging results that were available during my care of the patient were reviewed by me and considered in my medical decision making (see chart for details).  38 year old male presents with urinary frequency and intermittent bilateral flank pain over the past 3 to 4 days.  He denies dysuria or penile discharge, denies history of similar symptoms in the past, no history of kidney stones.  Also reports an intermittent cough.  No associated fevers or known sick contacts.  Patient reports he had recently negative Covid testing and does not wish to be tested again.  Patient's urinary symptoms and flank pain could be related to potential kidney stone or UTI.  Will check urinalysis and basic labs to evaluate for leukocytosis or abnormal kidney function.  We will also get CT renal stone study.  Will check chest x-ray regarding cough.  Patient declines COVID-19 testing.  He is afebrile with normal vitals, no hypoxia.  Patient's urinalysis with no signs of infection and no hematuria.  He has normal renal function no significant electrolyte derangements, no leukocytosis.  GC/Chlamydia testing is pending.  Patient's chest x-ray shows no evidence of  pneumonia or other active cardiopulmonary disease.  Discussed reassuring evaluation.  Discussed that flank pain could be musculoskeletal in nature, given patient's difficulty initiating urine stream also question if there could be prostate issues.  Will have patient follow-up with urology.  Return precautions discussed.  Discharged home in good condition.  Final Clinical Impressions(s) / ED Diagnoses   Final diagnoses:  Flank pain  Urinary frequency  Cough    ED Discharge Orders    None       1    Dartha LodgeFord, Machael Raine N, [place]PA-C 06/24/19 2304    Milagros Lollykstra, Richard S, MD 06/27/19 [phone]

## 2019-06-26 LAB — GC/CHLAMYDIA PROBE AMP (~~LOC~~) NOT AT ARMC
Chlamydia: NEGATIVE
Neisseria Gonorrhea: NEGATIVE

## 2019-08-10 ENCOUNTER — Ambulatory Visit: Payer: Self-pay

## 2019-08-10 ENCOUNTER — Other Ambulatory Visit: Payer: Self-pay

## 2019-08-10 ENCOUNTER — Other Ambulatory Visit: Payer: Self-pay | Admitting: Occupational Medicine

## 2019-08-10 DIAGNOSIS — M545 Low back pain, unspecified: Secondary | ICD-10-CM

## 2019-09-13 ENCOUNTER — Emergency Department (HOSPITAL_BASED_OUTPATIENT_CLINIC_OR_DEPARTMENT_OTHER)
Admission: EM | Admit: 2019-09-13 | Discharge: 2019-09-13 | Disposition: A | Discharge status: Home / Self Care | Payer: No Typology Code available for payment source | Attending: Emergency Medicine | Admitting: Emergency Medicine

## 2019-09-13 ENCOUNTER — Other Ambulatory Visit: Payer: Self-pay

## 2019-09-13 ENCOUNTER — Encounter (HOSPITAL_BASED_OUTPATIENT_CLINIC_OR_DEPARTMENT_OTHER): Payer: Self-pay | Admitting: Emergency Medicine

## 2019-09-13 DIAGNOSIS — Z888 Allergy status to other drugs, medicaments and biological substances status: Secondary | ICD-10-CM | POA: Insufficient documentation

## 2019-09-13 DIAGNOSIS — M545 Low back pain: Secondary | ICD-10-CM | POA: Diagnosis present

## 2019-09-13 DIAGNOSIS — Z79899 Other long term (current) drug therapy: Secondary | ICD-10-CM | POA: Insufficient documentation

## 2019-09-13 DIAGNOSIS — G8929 Other chronic pain: Secondary | ICD-10-CM | POA: Insufficient documentation

## 2019-09-13 MED ORDER — KETOROLAC TROMETHAMINE 30 MG/ML IJ SOLN
30.0000 mg | Freq: Once | INTRAMUSCULAR | Status: AC
  Administered 2019-09-13: 30 mg via INTRAMUSCULAR
  Filled 2019-09-13: qty 1

## 2019-09-13 MED ORDER — CYCLOBENZAPRINE HCL 10 MG PO TABS: 10.0000 mg | ORAL_TABLET | Freq: Three times a day (TID) | ORAL | 0 refills | Status: DC | PRN

## 2019-09-13 NOTE — Discharge Instructions (Addendum)
If you develop worsening, recurrent, or continued back pain, numbness or weakness in the legs, incontinence of your bowels or bladders, numbness of your buttocks, fever, abdominal pain, or any other new/concerning symptoms then return to the ER for evaluation.   Do not take the Flexeril with other medications that can cause sleepiness, drowsiness, etc. such as alcohol.  Do not drive or operate heavy machinery with this medicine.

## 2019-09-13 NOTE — ED Triage Notes (Signed)
Pt here with acute on chronic right sided back pain that radiates down to leg.

## 2019-09-13 NOTE — ED Provider Notes (Signed)
[facility] EMERGENCY DEPARTMENT Provider Note   CSN: [id number] Arrival date & time: 09/13/19  5852     History Chief Complaint  Patient presents with  . Back Pain    Chad Nichols is a 39 y.o. male.  HPI 39 year old male presents with acute on chronic low back pain.  Has been having back pain since 2017 after injury.  Follows with Dr. Shon Baton.  Had an MRI at the end of last month but does not know the results yet.  Chronically has some pain that goes into his left proximal thigh.  Today, when awakening his pain seem to be worse than typical.  Not a new pain or different besides more severe.  Did not take anything for it but presented here to make sure he is okay.  No fevers, urinary/bowel incontinence, or new weakness/numbness.   Past Medical History:  Diagnosis Date  . Back pain   . Bulging lumbar disc   . Shoulder pain     There are no problems to display for this patient.   History reviewed. No pertinent surgical history.     History reviewed. No pertinent family history.  Social History   Tobacco Use  . Smoking status: Former Games developer  . Smokeless tobacco: Never Used  Substance Use Topics  . Alcohol use: Yes    Comment: occ  . Drug use: Never    Home Medications Prior to Admission medications   Medication Sig Start Date End Date Taking? Authorizing Provider  cyclobenzaprine (FLEXERIL) 10 MG tablet Take 1 tablet (10 mg total) by mouth 3 (three) times daily as needed for muscle spasms. 09/13/19   Pricilla Loveless, MD  dexamethasone (DECADRON) 4 MG tablet Take 1 tablet (4 mg total) by mouth 2 (two) times daily. 06/10/19   Raeford Razor, MD  meloxicam (MOBIC) 15 MG tablet Take 1 tablet (15 mg total) by mouth daily. 06/10/19   Raeford Razor, MD  methocarbamol (ROBAXIN) 500 MG tablet Take 1 tablet (500 mg total) by mouth 2 (two) times daily. 09/22/18   Jeannie Fend, PA-C    Allergies    Pregabalin  Review of Systems   Review of Systems    Constitutional: Negative for fever.  Gastrointestinal: Negative for abdominal pain.  Genitourinary:       No incontinence  Musculoskeletal: Positive for back pain.  Neurological: Negative for weakness and numbness.    Physical Exam Updated Vital Signs BP 119/70 Comment: Retake  Pulse 81   Temp 97.9 F (36.6 C)   SpO2 98%   Physical Exam Vitals and nursing note reviewed.  Constitutional:      Appearance: He is well-developed.  HENT:     Head: Normocephalic and atraumatic.     Right Ear: External ear normal.     Left Ear: External ear normal.     Nose: Nose normal.  Eyes:     General:        Right eye: No discharge.        Left eye: No discharge.  Cardiovascular:     Rate and Rhythm: Normal rate and regular rhythm.     Heart sounds: Normal heart sounds.  Pulmonary:     Effort: Pulmonary effort is normal.  Abdominal:     General: There is no distension.     Palpations: Abdomen is soft.     Tenderness: There is no abdominal tenderness.  Musculoskeletal:     Cervical back: Neck supple.     Thoracic back: No  tenderness.     Lumbar back: No tenderness.  Skin:    General: Skin is warm and dry.  Neurological:     Mental Status: He is alert.     Comments: 5/5 strength in BLE. Normal gross sensation  Psychiatric:        Mood and Affect: Mood is not anxious.     ED Results / Procedures / Treatments   Labs (all labs ordered are listed, but only abnormal results are displayed) Labs Reviewed - No data to display  EKG None  Radiology No results found.  Procedures Procedures (including critical care time)  Medications Ordered in ED Medications  ketorolac (TORADOL) 30 MG/ML injection 30 mg (has no administration in time range)    ED Course  I have reviewed the triage vital signs and the nursing notes.  Pertinent labs & imaging results that were available during my care of the patient were reviewed by me and considered in my medical decision making (see chart  for details).    MDM Rules/Calculators/A&P                      Patient appears to have an acute exacerbation of his chronic back pain.  Currently the pain is better than it was at first this morning.  Neuro exam is benign and I have very low suspicion for acute spinal cord emergency.  He is not on muscle relaxers and so after discussion, we will try this at home in addition to his NSAIDs.  Will give Toradol here.  Return precautions and follow-up with orthopedics. Final Clinical Impression(s) / ED Diagnoses Final diagnoses:  Acute exacerbation of chronic low back pain    Rx / DC Orders ED Discharge Orders         Ordered    cyclobenzaprine (FLEXERIL) 10 MG tablet  3 times daily PRN     09/13/19 8295           Sherwood Gambler, MD 09/13/19 [phone]

## 2020-06-09 ENCOUNTER — Other Ambulatory Visit: Payer: Self-pay

## 2020-06-09 ENCOUNTER — Emergency Department (HOSPITAL_BASED_OUTPATIENT_CLINIC_OR_DEPARTMENT_OTHER)
Admission: EM | Admit: 2020-06-09 | Discharge: 2020-06-09 | Disposition: A | Discharge status: Home / Self Care | Payer: 59 | Attending: Emergency Medicine | Admitting: Emergency Medicine

## 2020-06-09 ENCOUNTER — Encounter (HOSPITAL_BASED_OUTPATIENT_CLINIC_OR_DEPARTMENT_OTHER): Payer: Self-pay | Admitting: Emergency Medicine

## 2020-06-09 DIAGNOSIS — S39012A Strain of muscle, fascia and tendon of lower back, initial encounter: Secondary | ICD-10-CM | POA: Insufficient documentation

## 2020-06-09 DIAGNOSIS — T148XXA Other injury of unspecified body region, initial encounter: Secondary | ICD-10-CM

## 2020-06-09 DIAGNOSIS — Z87891 Personal history of nicotine dependence: Secondary | ICD-10-CM | POA: Insufficient documentation

## 2020-06-09 DIAGNOSIS — X500XXA Overexertion from strenuous movement or load, initial encounter: Secondary | ICD-10-CM | POA: Insufficient documentation

## 2020-06-09 MED ORDER — LORAZEPAM 1 MG PO TABS
1.0000 mg | ORAL_TABLET | Freq: Once | ORAL | Status: AC
  Administered 2020-06-09: 1 mg via ORAL
  Filled 2020-06-09: qty 1

## 2020-06-09 MED ORDER — LIDOCAINE 5 % EX PTCH
1.0000 | MEDICATED_PATCH | CUTANEOUS | Status: DC
  Administered 2020-06-09: 1 via TRANSDERMAL
  Filled 2020-06-09: qty 1

## 2020-06-09 MED ORDER — PREDNISONE 10 MG (21) PO TBPK: ORAL_TABLET | Freq: Every day | ORAL | 0 refills | Status: DC

## 2020-06-09 MED ORDER — CYCLOBENZAPRINE HCL 10 MG PO TABS: 10.0000 mg | ORAL_TABLET | Freq: Two times a day (BID) | ORAL | 0 refills | Status: DC | PRN

## 2020-06-09 MED ORDER — METHOCARBAMOL 500 MG PO TABS
500.0000 mg | ORAL_TABLET | Freq: Once | ORAL | Status: AC
  Administered 2020-06-09: 500 mg via ORAL
  Filled 2020-06-09: qty 1

## 2020-06-09 NOTE — ED Provider Notes (Signed)
[facility] EMERGENCY DEPARTMENT Provider Note   CSN: [id number] Arrival date & time: 06/09/20  1116     History Chief Complaint  Patient presents with  . Back Pain    Chad Nichols is a 39 y.o. male.  HPI Patient is a 39 year old male with a history of chronic back pain who presents to the emergency department with acute lumbar strain that occurred last night.  Patient states he was lifting weights and felt a sudden "pull" in the left lumbar region.  His pain has been constant.  He states that it is more severe than previous exacerbations.  He took a Vicodin this morning with minimal relief.  States his pain radiates down his left leg with associated paresthesias.  No numbness, weakness, saddle anesthesia, bowel or bladder incontinence, LOC, head trauma.  Patient is followed by Dr. Shon Baton with orthopedics.    Past Medical History:  Diagnosis Date  . Back pain   . Bulging lumbar disc   . Shoulder pain     There are no problems to display for this patient.   History reviewed. No pertinent surgical history.     History reviewed. No pertinent family history.  Social History   Tobacco Use  . Smoking status: Former Games developer  . Smokeless tobacco: Never Used  Vaping Use  . Vaping Use: Never used  Substance Use Topics  . Alcohol use: Yes    Comment: occ  . Drug use: Never    Home Medications Prior to Admission medications   Medication Sig Start Date End Date Taking? Authorizing Provider  cyclobenzaprine (FLEXERIL) 10 MG tablet Take 1 tablet (10 mg total) by mouth 3 (three) times daily as needed for muscle spasms. 09/13/19   Pricilla Loveless, MD  dexamethasone (DECADRON) 4 MG tablet Take 1 tablet (4 mg total) by mouth 2 (two) times daily. 06/10/19   Raeford Razor, MD  meloxicam (MOBIC) 15 MG tablet Take 1 tablet (15 mg total) by mouth daily. 06/10/19   Raeford Razor, MD  methocarbamol (ROBAXIN) 500 MG tablet Take 1 tablet (500 mg total) by mouth 2 (two)  times daily. 09/22/18   Jeannie Fend, PA-C    Allergies    Pregabalin  Review of Systems   Review of Systems  Musculoskeletal: Positive for back pain and myalgias.  Skin: Negative for wound.  Neurological: Negative for syncope, weakness and numbness.   Physical Exam Updated Vital Signs BP 118/70 (BP Location: Right Arm)   Pulse 70   Temp 98 F (36.7 C) (Oral)   Resp 20   Ht 6\' 8"  (2.032 m)   Wt 120.2 kg   SpO2 99%   BMI 29.11 kg/m   Physical Exam Vitals and nursing note reviewed.  Constitutional:      General: He is not in acute distress.    Appearance: He is well-developed.  HENT:     Head: Normocephalic and atraumatic.     Right Ear: External ear normal.     Left Ear: External ear normal.  Eyes:     General: No scleral icterus.       Right eye: No discharge.        Left eye: No discharge.     Conjunctiva/sclera: Conjunctivae normal.  Neck:     Trachea: No tracheal deviation.  Cardiovascular:     Rate and Rhythm: Normal rate.  Pulmonary:     Effort: Pulmonary effort is normal. No respiratory distress.     Breath sounds: No stridor.  Abdominal:     General: There is no distension.  Musculoskeletal:        General: Tenderness present. No swelling or deformity.     Cervical back: Neck supple.     Comments: Exquisite TTP with a palpable muscle spasm noted along the left lumbar musculature.  No midline C, T, L-spine tenderness.  Skin:    General: Skin is warm and dry.     Findings: No rash.  Neurological:     General: No focal deficit present.     Mental Status: He is alert and oriented to person, place, and time.     Cranial Nerves: Cranial nerve deficit: no gross deficits.     Comments: Symmetrical strength in the bilateral lower extremities.  Distal sensation intact.  Palpable pedal pulses.  Psychiatric:        Mood and Affect: Mood normal.        Behavior: Behavior normal.    ED Results / Procedures / Treatments   Labs (all labs ordered are listed,  but only abnormal results are displayed) Labs Reviewed - No data to display  EKG None  Radiology No results found.  Procedures Procedures (including critical care time)  Medications Ordered in ED Medications  lidocaine (LIDODERM) 5 % 1 patch (1 patch Transdermal Patch Applied 06/09/20 1228)  methocarbamol (ROBAXIN) tablet 500 mg (500 mg Oral Given 06/09/20 1228)  LORazepam (ATIVAN) tablet 1 mg (1 mg Oral Given 06/09/20 1228)   ED Course  I have reviewed the triage vital signs and the nursing notes.  Pertinent labs & imaging results that were available during my care of the patient were reviewed by me and considered in my medical decision making (see chart for details).    MDM Rules/Calculators/A&P                          Patient presents today with muscle spasm in the left lumbar region.  Patient is followed by Dr. Shon Baton with orthopedics.  Patient was given a Lidoderm patch as well as a dose of Robaxin and Ativan.  He has been monitored in the emergency department for the last couple of hours.  States his pain has improved and he can now ambulate without assistance.  Patient is having a palpable muscle spasm in the left lumbar region but has no midline spine tenderness.  Did not feel that new imaging was warranted today and patient is agreeable.  Neurovascularly intact in the lower extremities.  No saddle anesthesia or bowel or bladder incontinence.  Exam nonconcerning for cauda equina at this time.  We will discharge with a course of Flexeril.  We discussed safety regarding this medication.  We will also start patient on a course of prednisone.  No known history of DM.  Recommended that he follow-up with Dr. Shon Baton next week for reevaluation.  Return to the ED with new or worsening symptoms.  His questions were answered and he was amicable at the time of discharge.  His vital signs are stable.  Final Clinical Impression(s) / ED Diagnoses Final diagnoses:  Strain of lumbar region,  initial encounter  Muscle strain    Rx / DC Orders ED Discharge Orders         Ordered    cyclobenzaprine (FLEXERIL) 10 MG tablet  2 times daily PRN        06/09/20 1414    predniSONE (STERAPRED UNI-PAK 21 TAB) 10 MG (21) TBPK tablet  Daily  06/09/20 1414           Placido Sou, PA-C 06/09/20 1417    Vanetta Mulders, MD 07/09/20 1443

## 2020-06-09 NOTE — ED Notes (Addendum)
Pt reports pulling back last night while lifting weights, had existing back issues from 2017, took vicodin at home an hour ago.  Ambulatory with discomfort.  Pain radiates down left leg
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
## 2020-06-09 NOTE — Discharge Instructions (Signed)
I am prescribing you a strong muscle relaxer called flexeril. Please only take this medication as needed for your symptoms. This medication can make you quite drowsy. Do not mix it with alcohol. Do not drive a vehicle after taking it.  Do not take it more than prescribed.  I am also prescribe a steroid medication.  This is a tapered medication so as you continue to take it you will take less and less.  This should help with the inflammation and hopefully with your pain.  If your symptoms worsen you can always return to the emergency department.  Please follow-up with your orthopedist Dr. Shon Baton for reevaluation.  It was a pleasure to meet you.

## 2020-07-14 ENCOUNTER — Encounter (HOSPITAL_BASED_OUTPATIENT_CLINIC_OR_DEPARTMENT_OTHER): Payer: Self-pay | Admitting: *Deleted

## 2020-07-14 ENCOUNTER — Emergency Department (HOSPITAL_BASED_OUTPATIENT_CLINIC_OR_DEPARTMENT_OTHER)
Admission: EM | Admit: 2020-07-14 | Discharge: 2020-07-14 | Disposition: A | Discharge status: Home / Self Care | Payer: No Typology Code available for payment source | Attending: Emergency Medicine | Admitting: Emergency Medicine

## 2020-07-14 ENCOUNTER — Other Ambulatory Visit: Payer: Self-pay

## 2020-07-14 DIAGNOSIS — S161XXA Strain of muscle, fascia and tendon at neck level, initial encounter: Secondary | ICD-10-CM | POA: Insufficient documentation

## 2020-07-14 DIAGNOSIS — M62838 Other muscle spasm: Secondary | ICD-10-CM | POA: Insufficient documentation

## 2020-07-14 DIAGNOSIS — R519 Headache, unspecified: Secondary | ICD-10-CM | POA: Insufficient documentation

## 2020-07-14 DIAGNOSIS — S199XXA Unspecified injury of neck, initial encounter: Secondary | ICD-10-CM | POA: Diagnosis present

## 2020-07-14 DIAGNOSIS — Z87891 Personal history of nicotine dependence: Secondary | ICD-10-CM | POA: Diagnosis not present

## 2020-07-14 DIAGNOSIS — Y92410 Unspecified street and highway as the place of occurrence of the external cause: Secondary | ICD-10-CM | POA: Insufficient documentation

## 2020-07-14 MED ORDER — MELOXICAM 15 MG PO TABS: 15.0000 mg | ORAL_TABLET | Freq: Every day | ORAL | 0 refills | Status: DC

## 2020-07-14 MED ORDER — IBUPROFEN 800 MG PO TABS: 800.0000 mg | ORAL_TABLET | Freq: Three times a day (TID) | ORAL | 0 refills | Status: DC | PRN

## 2020-07-14 MED ORDER — CYCLOBENZAPRINE HCL 10 MG PO TABS: 10.0000 mg | ORAL_TABLET | Freq: Two times a day (BID) | ORAL | 0 refills | Status: DC | PRN
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
## 2020-07-14 NOTE — ED Notes (Signed)
Ice pack given
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
## 2020-07-19 ENCOUNTER — Other Ambulatory Visit: Payer: Self-pay

## 2020-07-19 ENCOUNTER — Emergency Department (HOSPITAL_BASED_OUTPATIENT_CLINIC_OR_DEPARTMENT_OTHER)
Admission: EM | Admit: 2020-07-19 | Discharge: 2020-07-19 | Disposition: A | Discharge status: Home / Self Care | Payer: No Typology Code available for payment source | Attending: Emergency Medicine | Admitting: Emergency Medicine

## 2020-07-19 ENCOUNTER — Emergency Department (HOSPITAL_BASED_OUTPATIENT_CLINIC_OR_DEPARTMENT_OTHER): Payer: No Typology Code available for payment source

## 2020-07-19 ENCOUNTER — Encounter (HOSPITAL_BASED_OUTPATIENT_CLINIC_OR_DEPARTMENT_OTHER): Payer: Self-pay | Admitting: Emergency Medicine

## 2020-07-19 DIAGNOSIS — Z87891 Personal history of nicotine dependence: Secondary | ICD-10-CM | POA: Diagnosis not present

## 2020-07-19 DIAGNOSIS — R197 Diarrhea, unspecified: Secondary | ICD-10-CM | POA: Diagnosis present

## 2020-07-19 DIAGNOSIS — A09 Infectious gastroenteritis and colitis, unspecified: Secondary | ICD-10-CM

## 2020-07-19 DIAGNOSIS — B349 Viral infection, unspecified: Secondary | ICD-10-CM

## 2020-07-19 DIAGNOSIS — R0789 Other chest pain: Secondary | ICD-10-CM | POA: Diagnosis not present

## 2020-07-19 DIAGNOSIS — Z20822 Contact with and (suspected) exposure to covid-19: Secondary | ICD-10-CM | POA: Insufficient documentation

## 2020-07-19 LAB — RESP PANEL BY RT-PCR (FLU A&B, COVID) ARPGX2
Influenza A by PCR: NEGATIVE
Influenza B by PCR: NEGATIVE
SARS Coronavirus 2 by RT PCR: NEGATIVE

## 2020-07-19 MED ORDER — ONDANSETRON HCL 4 MG PO TABS: 4.0000 mg | ORAL_TABLET | Freq: Four times a day (QID) | ORAL | 0 refills | Status: DC

## 2020-07-19 MED ORDER — ACETAMINOPHEN 325 MG PO TABS
650.0000 mg | ORAL_TABLET | Freq: Once | ORAL | Status: AC
  Administered 2020-07-19: 18:00:00 650 mg via ORAL
  Filled 2020-07-19: qty 2

## 2020-07-19 MED ORDER — LOPERAMIDE HCL 2 MG PO CAPS
4.0000 mg | ORAL_CAPSULE | Freq: Once | ORAL | Status: AC
  Administered 2020-07-19: 23:00:00 4 mg via ORAL
  Filled 2020-07-19: qty 2

## 2020-07-19 MED ORDER — LOPERAMIDE HCL 2 MG PO CAPS: 2.0000 mg | ORAL_CAPSULE | Freq: Four times a day (QID) | ORAL | 0 refills | Status: DC | PRN

## 2020-07-19 MED ORDER — IBUPROFEN 400 MG PO TABS
400.0000 mg | ORAL_TABLET | Freq: Once | ORAL | Status: AC
  Administered 2020-07-19: 23:00:00 400 mg via ORAL
  Filled 2020-07-19: qty 1

## 2020-07-19 NOTE — ED Notes (Signed)
Continues to await for EDP evaluation 

## 2020-07-19 NOTE — ED Notes (Signed)
Here having fever, chills, and having diarrhea. Onset this past Tuesday. Denies any nausea and vomiting, able to drink and eat without any issues. Has not taken any anti diarrhea OTC medicine.

## 2020-07-19 NOTE — ED Notes (Signed)
Resting quietly, awaiting EDP evaluation, comfort measures provided
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
## 2020-07-19 NOTE — ED Provider Notes (Signed)
[facility] EMERGENCY DEPARTMENT Provider Note   CSN: [id number] Arrival date & time: 07/19/20  1806     History Chief Complaint  Patient presents with   Chest Pain   Diarrhea    Chad Nichols is a 39 y.o. male.  Patient presents that he has been sick for 3 or 4 days.  Patient has been experiencing persistent diarrhea.  He has also experienced runny nose, headache, dry mouth with chills.  Tonight he was driving and he started to feel very warm, sweaty then felt some discomfort in his chest.  He presented to the ER for evaluation.        Past Medical History:  Diagnosis Date   Back pain    Bulging lumbar disc    Shoulder pain     There are no problems to display for this patient.   History reviewed. No pertinent surgical history.     No family history on file.  Social History   Tobacco Use   Smoking status: Former Smoker   Smokeless tobacco: Never Used  Building services engineer Use: Never used  Substance Use Topics   Alcohol use: Yes    Comment: occ   Drug use: Never    Home Medications Prior to Admission medications   Medication Sig Start Date End Date Taking? Authorizing Provider  cyclobenzaprine (FLEXERIL) 10 MG tablet Take 1 tablet (10 mg total) by mouth 2 (two) times daily as needed for muscle spasms. 07/14/20   Curatolo, Adam, DO  ibuprofen (ADVIL) 800 MG tablet Take 1 tablet (800 mg total) by mouth every 8 (eight) hours as needed for up to 30 doses. 07/14/20   Curatolo, Adam, DO  loperamide (IMODIUM) 2 MG capsule Take 1 capsule (2 mg total) by mouth 4 (four) times daily as needed for diarrhea or loose stools. 07/19/20   Gilda Crease, MD  ondansetron (ZOFRAN) 4 MG tablet Take 1 tablet (4 mg total) by mouth every 6 (six) hours. 07/19/20   Gilda Crease, MD    Allergies    Pregabalin  Review of Systems   Review of Systems  Constitutional: Positive for chills and fever.  Gastrointestinal: Positive for  diarrhea.  All other systems reviewed and are negative.   Physical Exam Updated Vital Signs BP 114/73 (BP Location: Right Arm)    Pulse 98    Temp 99.5 F (37.5 C) (Oral)    Resp (!) 22    Ht 6\' 8"  (2.032 m)    Wt 119.3 kg    SpO2 97%    BMI 28.89 kg/m   Physical Exam Vitals and nursing note reviewed.  Constitutional:      General: He is not in acute distress.    Appearance: Normal appearance. He is well-developed and well-nourished.  HENT:     Head: Normocephalic and atraumatic.     Right Ear: Hearing normal.     Left Ear: Hearing normal.     Nose: Nose normal.     Mouth/Throat:     Mouth: Oropharynx is clear and moist and mucous membranes are normal.  Eyes:     Extraocular Movements: EOM normal.     Conjunctiva/sclera: Conjunctivae normal.     Pupils: Pupils are equal, round, and reactive to light.  Cardiovascular:     Rate and Rhythm: Regular rhythm.     Heart sounds: S1 normal and S2 normal. No murmur heard. No friction rub. No gallop.   Pulmonary:     Effort:  Pulmonary effort is normal. No respiratory distress.     Breath sounds: Normal breath sounds.  Chest:     Chest wall: No tenderness.  Abdominal:     General: Bowel sounds are normal.     Palpations: Abdomen is soft. There is no hepatosplenomegaly.     Tenderness: There is no abdominal tenderness. There is no guarding or rebound. Negative signs include Murphy's sign and McBurney's sign.     Hernia: No hernia is present.  Musculoskeletal:        General: Normal range of motion.     Cervical back: Normal range of motion and neck supple.  Skin:    General: Skin is warm, dry and intact.     Findings: No rash.     Nails: There is no cyanosis.  Neurological:     Mental Status: He is alert and oriented to person, place, and time.     GCS: GCS eye subscore is 4. GCS verbal subscore is 5. GCS motor subscore is 6.     Cranial Nerves: No cranial nerve deficit.     Sensory: No sensory deficit.     Coordination:  Coordination normal.     Deep Tendon Reflexes: Strength normal.  Psychiatric:        Mood and Affect: Mood and affect normal.        Speech: Speech normal.        Behavior: Behavior normal.        Thought Content: Thought content normal.     ED Results / Procedures / Treatments   Labs (all labs ordered are listed, but only abnormal results are displayed) Labs Reviewed  RESP PANEL BY RT-PCR (FLU A&B, COVID) [id number]    EKG EKG Interpretation  Date/Time:  Friday July 19 2020 18:06:53 EST Ventricular Rate:  106 PR Interval:  148 QRS Duration: 74 QT Interval:  320 QTC Calculation: 425 R Axis:   -19 Text Interpretation: Sinus tachycardia Possible Inferior infarct , age undetermined Abnormal ECG Confirmed by Gilda Crease [phone]) on 07/19/2020 11:00:23 PM   Radiology DG Chest Portable 1 View  Result Date: 07/19/2020 CLINICAL DATA:  Chest pain, and chills and congestion. EXAM: PORTABLE CHEST 1 VIEW COMPARISON:  PA and lateral chest 06/22/2019. FINDINGS: Lungs clear. Heart size normal. No pneumothorax or pleural fluid. No acute or focal bony abnormality. IMPRESSION: Negative chest. Electronically Signed   By: Drusilla Kanner M.D.   On: 07/19/2020 18:57    Procedures Procedures (including critical care time)  Medications Ordered in ED Medications  loperamide (IMODIUM) capsule 4 mg (has no administration in time range)  ibuprofen (ADVIL) tablet 400 mg (has no administration in time range)  acetaminophen (TYLENOL) tablet 650 mg (650 mg Oral Given 07/19/20 1822)    ED Course  I have reviewed the triage vital signs and the nursing notes.  Pertinent labs & imaging results that were available during my care of the patient were reviewed by me and considered in my medical decision making (see chart for details).    MDM Rules/Calculators/A&P                          Patient with illness ongoing for 3 or 4 days.  He has had fever, chills, sweats, diarrhea.  Patient  developed some chest pain earlier today but this has resolved.  He was febrile at arrival which suggests infectious etiology of all of his symptoms.  Covid swab negative.  Chest x-ray  clear.  Patient not experiencing abdominal pain, abdominal exam benign currently.  I do not feel he requires any blood work at this time.  Symptoms do seem viral in nature.  Covid ruled out and pneumonia ruled out.  Will treat symptomatically.  Final Clinical Impression(s) / ED Diagnoses Final diagnoses:  Diarrhea of infectious origin  Viral illness    Rx / DC Orders ED Discharge Orders         Ordered    ondansetron (ZOFRAN) 4 MG tablet  Every 6 hours        07/19/20 2312    loperamide (IMODIUM) 2 MG capsule  4 times daily PRN        07/19/20 2312           Gilda Crease, MD 07/19/20 2312

## 2020-07-19 NOTE — ED Notes (Signed)
AVS provided to client, discussed Rx sent electronically to the pharmacy of his choice. Discussed the need to increase his PO fluids. Copy of AVS provided

## 2020-08-03 DIAGNOSIS — Z8616 Personal history of COVID-19: Secondary | ICD-10-CM

## 2020-08-03 HISTORY — DX: Personal history of COVID-19: Z86.16

## 2020-08-19 ENCOUNTER — Emergency Department (HOSPITAL_BASED_OUTPATIENT_CLINIC_OR_DEPARTMENT_OTHER)
Admission: EM | Admit: 2020-08-19 | Discharge: 2020-08-19 | Disposition: A | Discharge status: Home / Self Care | Payer: No Typology Code available for payment source | Attending: Emergency Medicine | Admitting: Emergency Medicine

## 2020-08-19 ENCOUNTER — Encounter (HOSPITAL_BASED_OUTPATIENT_CLINIC_OR_DEPARTMENT_OTHER): Payer: Self-pay

## 2020-08-19 ENCOUNTER — Emergency Department (HOSPITAL_BASED_OUTPATIENT_CLINIC_OR_DEPARTMENT_OTHER): Payer: No Typology Code available for payment source

## 2020-08-19 ENCOUNTER — Other Ambulatory Visit: Payer: Self-pay

## 2020-08-19 DIAGNOSIS — R0789 Other chest pain: Secondary | ICD-10-CM | POA: Insufficient documentation

## 2020-08-19 DIAGNOSIS — Z87891 Personal history of nicotine dependence: Secondary | ICD-10-CM | POA: Insufficient documentation

## 2020-08-19 DIAGNOSIS — M549 Dorsalgia, unspecified: Secondary | ICD-10-CM | POA: Insufficient documentation

## 2020-08-19 LAB — TROPONIN I (HIGH SENSITIVITY): Troponin I (High Sensitivity): 3 ng/L (ref <18)

## 2020-08-19 MED ORDER — IBUPROFEN 800 MG PO TABS: 800.0000 mg | ORAL_TABLET | Freq: Three times a day (TID) | ORAL | 0 refills | Status: DC | PRN

## 2020-08-19 MED ORDER — KETOROLAC TROMETHAMINE 15 MG/ML IJ SOLN
30.0000 mg | Freq: Once | INTRAMUSCULAR | Status: AC
  Administered 2020-08-19: 30 mg via INTRAMUSCULAR
  Filled 2020-08-19: qty 2

## 2020-08-19 NOTE — ED Triage Notes (Signed)
Pt took home covid test 08/10/20 that resulted positive. States he is having chest pain and back pain that started last night.

## 2020-08-19 NOTE — ED Provider Notes (Signed)
[facility] EMERGENCY DEPARTMENT Provider Note   CSN: [id number] Arrival date & time: 08/19/20  0534     History Chief Complaint  Patient presents with  . Chest Pain    Chad Nichols is a 40 y.o. male.  HPI     This is a 40 year old with no reported past medical history who presents with chest and back pain.  Patient reports onset of symptoms yesterday.  He describes sharp left-sided chest pain that radiates into the left axilla.  He also describes pain in the right side of the back that radiates upwards towards his neck.  He states the pain is worse with certain movements.  He rates the pain a 10 of 10.  He took [facility] powder with minimal relief.  Denies any shortness of breath, cough, fever.  He did test positive for COVID-19 on 1/8.  He denies any worsening of pain with deep breathing.  No known history of blood clots and he denies leg swelling.  Denies any exertional nature to the symptoms.  Past Medical History:  Diagnosis Date  . Back pain   . Bulging lumbar disc   . Shoulder pain     There are no problems to display for this patient.   History reviewed. No pertinent surgical history.     History reviewed. No pertinent family history.  Social History   Tobacco Use  . Smoking status: Former Games developer  . Smokeless tobacco: Never Used  Vaping Use  . Vaping Use: Never used  Substance Use Topics  . Alcohol use: Yes    Comment: occ  . Drug use: Never    Home Medications Prior to Admission medications   Medication Sig Start Date End Date Taking? Authorizing Provider  cyclobenzaprine (FLEXERIL) 10 MG tablet Take 1 tablet (10 mg total) by mouth 2 (two) times daily as needed for muscle spasms. 07/14/20   Curatolo, Adam, DO  ibuprofen (ADVIL) 800 MG tablet Take 1 tablet (800 mg total) by mouth every 8 (eight) hours as needed for up to 30 doses. 08/19/20   Horton, Mayer Masker, MD  loperamide (IMODIUM) 2 MG capsule Take 1 capsule (2 mg total) by mouth 4  (four) times daily as needed for diarrhea or loose stools. 07/19/20   Gilda Crease, MD  ondansetron (ZOFRAN) 4 MG tablet Take 1 tablet (4 mg total) by mouth every 6 (six) hours. 07/19/20   Gilda Crease, MD    Allergies    Pregabalin  Review of Systems   Review of Systems  Constitutional: Negative for fever.  Respiratory: Negative for cough and shortness of breath.   Cardiovascular: Positive for chest pain. Negative for leg swelling.  Gastrointestinal: Negative for abdominal pain, nausea and vomiting.  Genitourinary: Negative for dysuria.  All other systems reviewed and are negative.   Physical Exam Updated Vital Signs BP 118/77 (BP Location: Right Arm)   Pulse 71   Temp 97.6 F (36.4 C) (Oral) Comment: Simultaneous filing. User may not have seen previous data. Comment (Src): Simultaneous filing. User may not have seen previous data.  Resp 18   Ht 2.032 m (6\' 8" )   Wt 120.2 kg   SpO2 99%   BMI 29.11 kg/m   Physical Exam Vitals and nursing note reviewed.  Constitutional:      Appearance: He is well-developed and well-nourished. He is not ill-appearing.  HENT:     Head: Normocephalic and atraumatic.  Eyes:     Pupils: Pupils are equal, round, and  reactive to light.  Cardiovascular:     Rate and Rhythm: Normal rate and regular rhythm.     Heart sounds: Normal heart sounds. No murmur heard.   Pulmonary:     Effort: Pulmonary effort is normal. No respiratory distress.     Breath sounds: Normal breath sounds. No wheezing.  Chest:     Chest wall: No tenderness.  Abdominal:     General: Bowel sounds are normal.     Palpations: Abdomen is soft.     Tenderness: There is no abdominal tenderness. There is no rebound.  Musculoskeletal:        General: No edema.     Cervical back: Neck supple.     Right lower leg: No tenderness. No edema.     Left lower leg: No tenderness. No edema.  Lymphadenopathy:     Cervical: No cervical adenopathy.  Skin:     General: Skin is warm and dry.  Neurological:     Mental Status: He is alert and oriented to person, place, and time.  Psychiatric:        Mood and Affect: Mood and affect and mood normal.     ED Results / Procedures / Treatments   Labs (all labs ordered are listed, but only abnormal results are displayed) Labs Reviewed  TROPONIN I (HIGH SENSITIVITY)  TROPONIN I (HIGH SENSITIVITY)    EKG EKG Interpretation  Date/Time:  Monday August 19 2020 05:57:25 EST Ventricular Rate:  75 PR Interval:    QRS Duration: 82 QT Interval:  373 QTC Calculation: 409 R Axis:   10 Text Interpretation: Sinus rhythm Prolonged PR interval Low voltage, precordial leads Abnormal R-wave progression, early transition Confirmed by Ross Marcus ([id number]) on 08/19/2020 6:17:16 AM   Radiology DG Chest Portable 1 View  Result Date: 08/19/2020 CLINICAL DATA:  40 year old male with chest and back pain. Positive COVID-19 on 08/10/2020. EXAM: PORTABLE CHEST 1 VIEW COMPARISON:  Portable chest 07/19/2020 and earlier. FINDINGS: Portable AP upright view at 0609 hours. Lung volumes and mediastinal contours remain within normal limits. Visualized tracheal air column is within normal limits. No pneumothorax or pleural effusion. Lung markings appear stable since 2020 and within normal limits. Allowing for portable technique the lungs are clear. No acute osseous abnormality identified. IMPRESSION: Negative portable chest. Electronically Signed   By: Odessa Fleming M.D.   On: 08/19/2020 06:25    Procedures Procedures (including critical care time)  Medications Ordered in ED Medications  ketorolac (TORADOL) 15 MG/ML injection 30 mg (30 mg Intramuscular Given 08/19/20 1914)    ED Course  I have reviewed the triage vital signs and the nursing notes.  Pertinent labs & imaging results that were available during my care of the patient were reviewed by me and considered in my medical decision making (see chart for details).     MDM Rules/Calculators/A&P                          Patient presents with chest pain.  Fairly atypical in nature.  Some features suggestive of musculoskeletal etiology.  He is nontoxic-appearing and vital signs are reassuring.  He is afebrile.  Tested COVID-positive on Wednesday.  EKG without ischemic or arrhythmic changes.  He is low risk for ACS.  1 troponin was obtained and is pending.  Chest x-ray without pneumothorax or pneumonia.  He is not hypoxic.  Patient was given IM Toradol for his discomfort.  Troponin pending.  If troponin negative, the  patient is reasonably screened he can be discharged home given the duration of symptoms.  Have low suspicion at this time for PE as he is not hypoxic or tachycardic.  Patient signed out to oncoming provider.     Final Clinical Impression(s) / ED Diagnoses Final diagnoses:  Atypical chest pain    Rx / DC Orders ED Discharge Orders         Ordered    ibuprofen (ADVIL) 800 MG tablet  Every 8 hours PRN        08/19/20 0657           Shon Baton, MD 08/19/20 2339

## 2020-08-19 NOTE — Discharge Instructions (Addendum)
You are seen today for chest pain.  Your EKG and chest x-ray are reassuring.  This may be musculoskeletal in nature.  Take ibuprofen as needed for pain.

## 2020-11-14 IMAGING — CT CT RENAL STONE PROTOCOL
2 of 4 series · 16 of 46 positions shown, 18 images · non-contrast
Comparison: Lumbar MRI 10/24/2015

CLINICAL DATA: Flank pain, stone disease suspected, bilateral flank
pain and urinary frequency for 3-4 days

EXAM:
CT ABDOMEN AND PELVIS WITHOUT CONTRAST
TECHNIQUE: Multidetector CT imaging of the abdomen and pelvis was performed
following the standard protocol without IV contrast.

[Series 2: axial st · axial · 0.75mm/px · z∈[+707,+1162]mm · 13 of 101 slices shown, 15 images]
[im 5/101  soft-tissue]
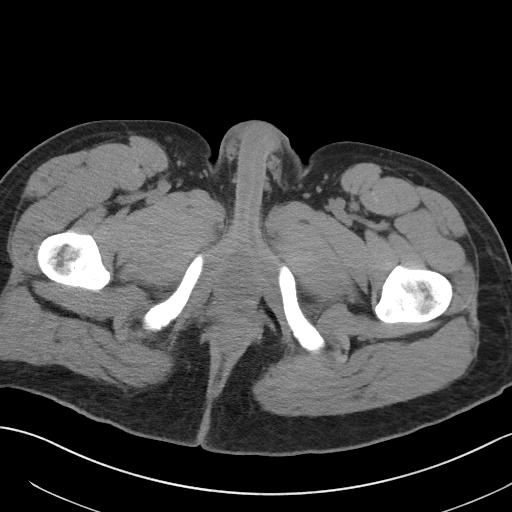
[im 5/101  bone]
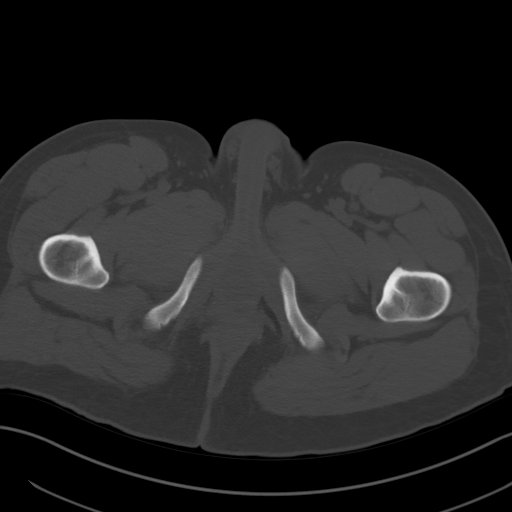
[im 13/101  soft-tissue]
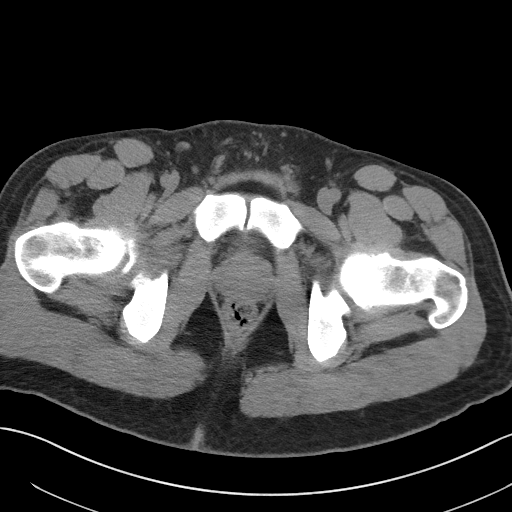
[im 21/101  soft-tissue]
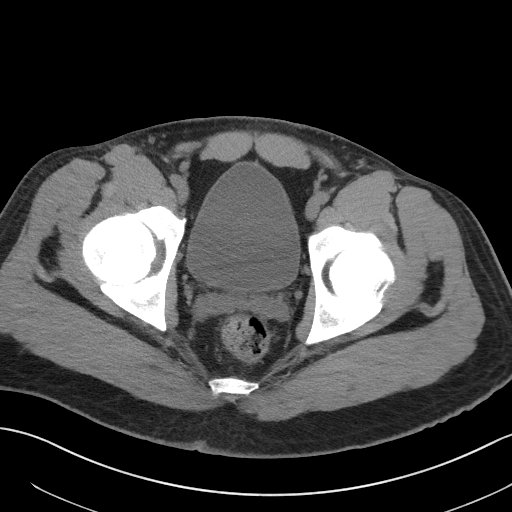
[im 30/101  soft-tissue]
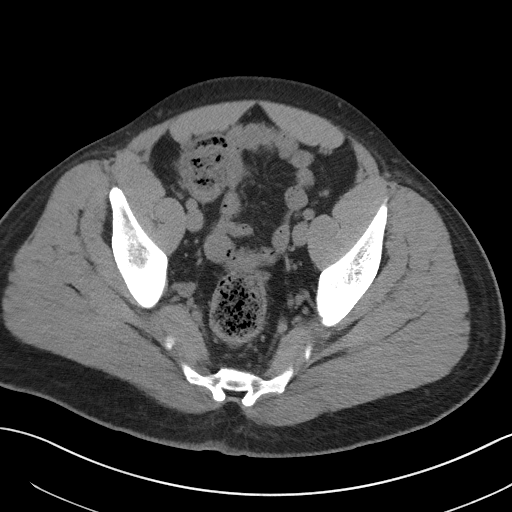
[im 34/101  soft-tissue]
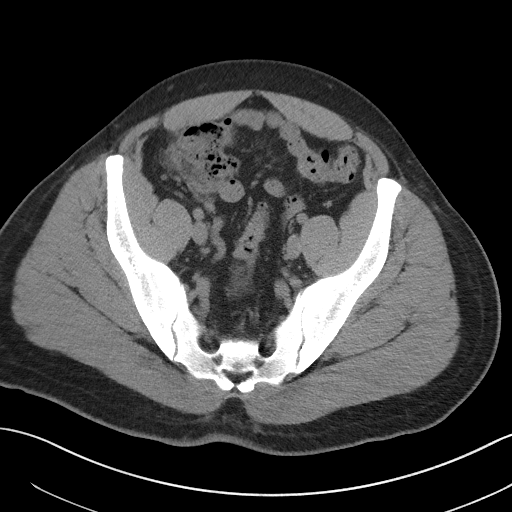
[im 42/101  soft-tissue]
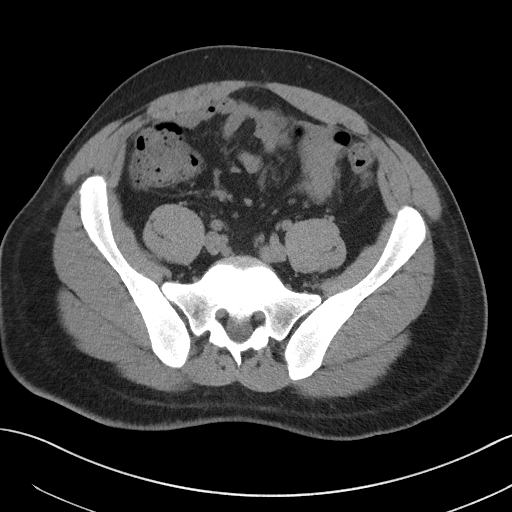
[im 51/101  soft-tissue]
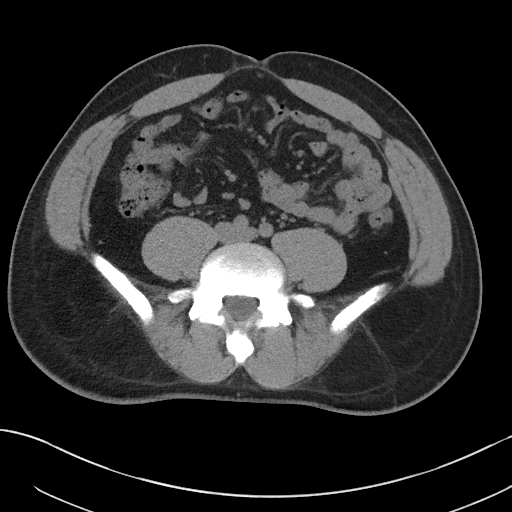
[im 59/101  soft-tissue]
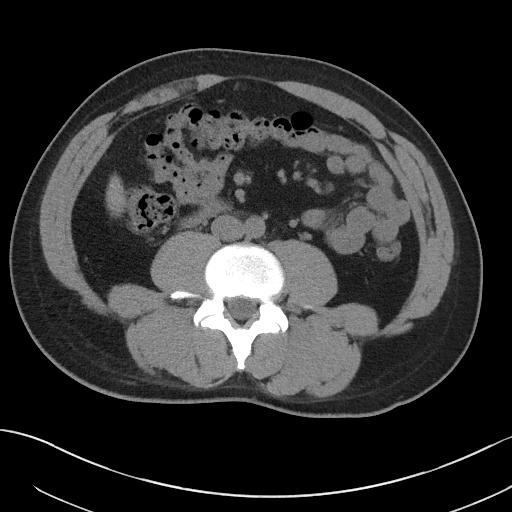
[im 67/101  soft-tissue]
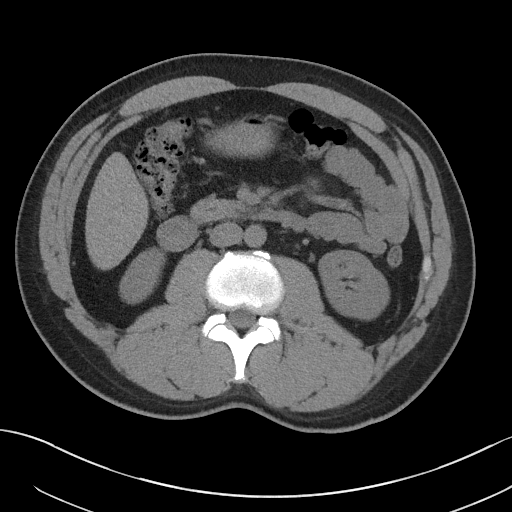
[im 67/101  bone]
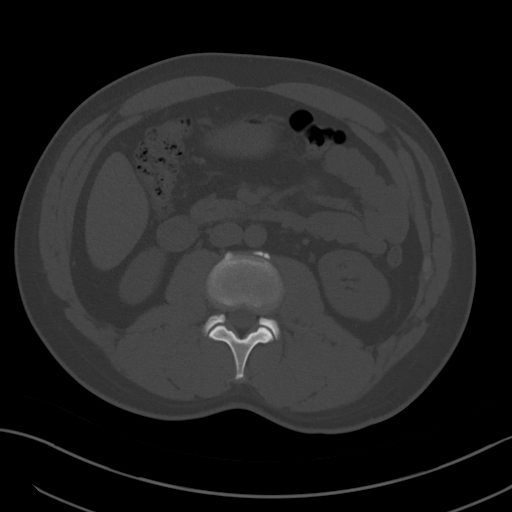
[im 71/101  soft-tissue]
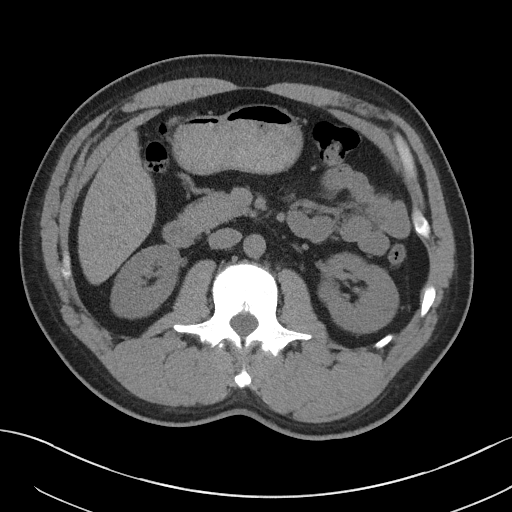
[im 80/101  soft-tissue]
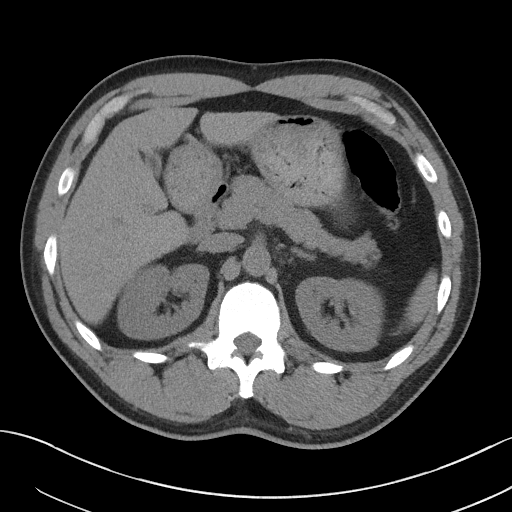
[im 88/101  soft-tissue]
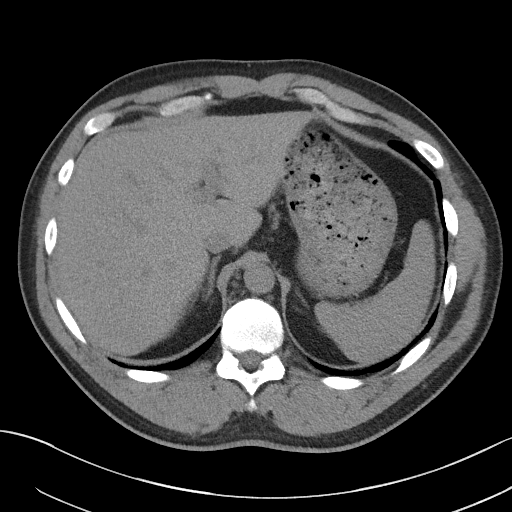
[im 96/101  soft-tissue]
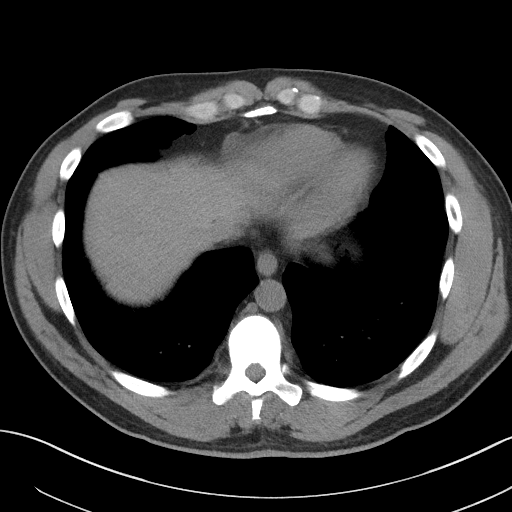

[Series 4: coronal st · coronal · 0.85mm/px · 3 of 109 slices shown]
[im 37/109  soft-tissue]
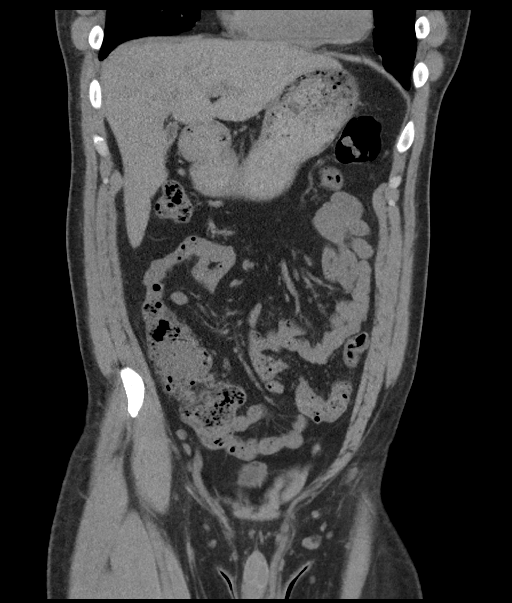
[im 49/109  soft-tissue]
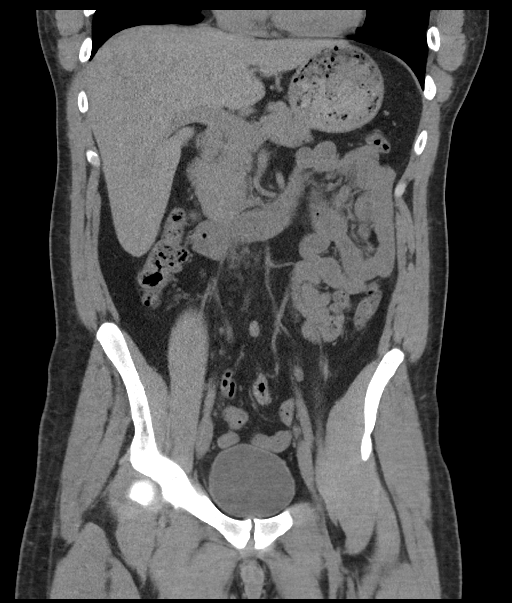
[im 61/109  soft-tissue]
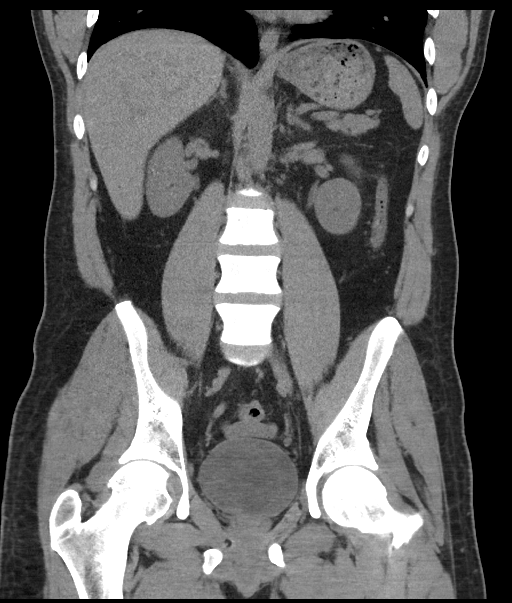

[16 of 46 positions shown; findings below may reference images not displayed]

FINDINGS: Lower chest: Small amount of scarring or atelectasis in the medial
right middle lobe. Lung bases otherwise clear. Normal heart size. No
pericardial effusion.

Hepatobiliary: No focal liver abnormality is seen. Gallbladder
largely decompressed. No visible gallstones, gallbladder wall
thickening, or biliary dilatation.

Pancreas: Unremarkable. No pancreatic ductal dilatation or
surrounding inflammatory changes.

Spleen: Normal in size without focal abnormality.

Adrenals/Urinary Tract: Normal adrenal glands. Few subcentimeter
hypoattenuating foci in both kidneys too small to fully characterize
on CT imaging but statistically likely benign. No visible or contour
deforming renal lesions. No significant perinephric stranding. No
urolithiasis or hydronephrosis. Urinary bladder is grossly
unremarkable.

Stomach/Bowel: Distal esophagus, stomach and duodenal sweep are
unremarkable. No small bowel wall thickening or dilatation. No
evidence of obstruction. A normal appendix is visualized. No colonic
dilatation or wall thickening. Scattered colonic diverticula without
focal pericolonic inflammation to suggest diverticulitis.

Vascular/Lymphatic: Few clustered, hazy mesenteric lymph nodes are
noted in the left upper quadrant (2/36) with minimal surrounding fat
stranding. No pathologically enlarged lymph nodes. The aorta is
normal caliber.

Reproductive: The prostate and seminal vesicles are unremarkable.

Other: No abdominopelvic free fluid or free gas. No bowel containing
hernias. Small fat containing umbilical hernia.

Musculoskeletal: No acute osseous abnormality or suspicious osseous
lesion.
IMPRESSION: 1. Few clustered, hazy mesenteric lymph nodes in the left upper
quadrant with minimal surrounding fat stranding, suggestive of of an
early or developing mesenteritis.
2. No urolithiasis or hydronephrosis.
3. Diverticulosis without evidence of acute diverticulitis.

## 2021-04-30 ENCOUNTER — Other Ambulatory Visit: Payer: Self-pay | Admitting: Orthopedic Surgery

## 2021-04-30 DIAGNOSIS — M25531 Pain in right wrist: Secondary | ICD-10-CM

## 2021-09-22 ENCOUNTER — Encounter (HOSPITAL_BASED_OUTPATIENT_CLINIC_OR_DEPARTMENT_OTHER): Payer: Self-pay | Admitting: Orthopedic Surgery

## 2021-09-22 ENCOUNTER — Other Ambulatory Visit: Payer: Self-pay

## 2021-09-22 NOTE — Progress Notes (Signed)
Spoke w/ via phone for pre-op interview--- pt Lab needs dos----  no             Lab results------ no COVID test -----patient states asymptomatic no test needed Arrive at ------- 0645 on 09-25-2021 NPO after MN NO Solid Food.  Clear liquids from MN until--- 0545 Med rec completed Medications to take morning of surgery ----- none Diabetic medication ----- n/a Patient instructed no nail polish to be worn day of surgery Patient instructed to bring photo id and insurance card day of surgery Patient aware to have Driver (ride ) / caregiver for 24 hours after surgery -- mother, janet Patient Special Instructions ----- n/a Pre-Op special Istructions ----- pre-op orders pending Patient verbalized understanding of instructions that were given at this phone interview. Patient denies shortness of breath, chest pain, fever, cough at this phone interview.

## 2021-09-23 NOTE — H&P (Signed)
Preoperative History & Physical Exam  Surgeon: Philipp Ovens, MD  Diagnosis: Right wrist TRIANGLE FIBRO CARTILAGE COMPLEX  tear  Planned Procedure: Procedure(s) (LRB): Right wrist scope TFCC debridement, possible repair (Right)  History of Present Illness:   Patient is a 41 y.o. male with symptoms consistent with Right wrist TRIANGLE FIBRO CARTILAGE COMPLEX  tear who presents for surgical intervention. The risks, benefits and alternatives of surgical intervention were discussed and informed consent was obtained prior to surgery.  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 Authorizing Provider  Multiple Vitamins-Minerals (MULTIVITAMIN ADULT) CHEW Chew by mouth daily.   Yes [provider]    Allergies:  Pregabalin  Review of Systems: Negative except per HPI.  Physical Exam: Alert and oriented, NAD Head and neck: no masses, normal alignment CV: pulse intact Pulm: no increased work of breathing, respirations even and unlabored Abdomen: non-distended Extremities: extremities warm and well perfused  LABS: No results found for this or any previous visit (from the past 2160 hour(s)).   Complete History and Physical exam available in the office notes  [name]

## 2021-09-25 ENCOUNTER — Encounter (HOSPITAL_BASED_OUTPATIENT_CLINIC_OR_DEPARTMENT_OTHER): Payer: Self-pay | Admitting: Orthopedic Surgery

## 2021-09-25 ENCOUNTER — Ambulatory Visit (HOSPITAL_BASED_OUTPATIENT_CLINIC_OR_DEPARTMENT_OTHER): Payer: Worker's Compensation | Admitting: Anesthesiology

## 2021-09-25 ENCOUNTER — Encounter (HOSPITAL_BASED_OUTPATIENT_CLINIC_OR_DEPARTMENT_OTHER)
Admission: RE | Disposition: A | Discharge status: Home / Self Care | Payer: Worker's Compensation | Source: Home / Self Care | Attending: Orthopedic Surgery

## 2021-09-25 ENCOUNTER — Other Ambulatory Visit: Payer: Self-pay

## 2021-09-25 ENCOUNTER — Ambulatory Visit (HOSPITAL_BASED_OUTPATIENT_CLINIC_OR_DEPARTMENT_OTHER): Payer: Worker's Compensation

## 2021-09-25 ENCOUNTER — Ambulatory Visit (HOSPITAL_BASED_OUTPATIENT_CLINIC_OR_DEPARTMENT_OTHER)
Admission: RE | Admit: 2021-09-25 | Discharge: 2021-09-25 | Disposition: A | Discharge status: Home / Self Care | Payer: Worker's Compensation | Attending: Orthopedic Surgery | Admitting: Orthopedic Surgery

## 2021-09-25 DIAGNOSIS — S63591A Other specified sprain of right wrist, initial encounter: Secondary | ICD-10-CM

## 2021-09-25 DIAGNOSIS — Z87891 Personal history of nicotine dependence: Secondary | ICD-10-CM | POA: Insufficient documentation

## 2021-09-25 DIAGNOSIS — X58XXXA Exposure to other specified factors, initial encounter: Secondary | ICD-10-CM | POA: Diagnosis not present

## 2021-09-25 DIAGNOSIS — S6981XA Other specified injuries of right wrist, hand and finger(s), initial encounter: Secondary | ICD-10-CM

## 2021-09-25 HISTORY — PX: WRIST ARTHROSCOPY WITH FOVEAL TRIANGULAR FIBROCARTILAGE COMPLEX REPAIR: SHX6403

## 2021-09-25 HISTORY — DX: Gastro-esophageal reflux disease without esophagitis: K21.9

## 2021-09-25 HISTORY — DX: Other specified sprain of right wrist, initial encounter: S63.591A

## 2021-09-25 HISTORY — DX: Other intervertebral disc degeneration, lumbosacral region without mention of lumbar back pain or lower extremity pain: M51.379

## 2021-09-25 HISTORY — DX: Other intervertebral disc degeneration, lumbosacral region: M51.37

## 2021-09-25 SURGERY — WRIST ARTHROSCOPY WITH FOVEAL TRIANGULAR FIBROCARTILAGE COMPLEX REPAIR
Anesthesia: Monitor Anesthesia Care | Site: Wrist | Laterality: Right

## 2021-09-25 MED ORDER — CEFAZOLIN IN SODIUM CHLORIDE 3-0.9 GM/100ML-% IV SOLN
INTRAVENOUS | Status: AC
  Filled 2021-09-25: qty 100

## 2021-09-25 MED ORDER — PROPOFOL 10 MG/ML IV BOLUS
INTRAVENOUS | Status: AC
  Filled 2021-09-25: qty 20

## 2021-09-25 MED ORDER — FENTANYL CITRATE (PF) 100 MCG/2ML IJ SOLN: 25.0000 ug | INTRAMUSCULAR | Status: DC | PRN

## 2021-09-25 MED ORDER — MIDAZOLAM HCL 2 MG/2ML IJ SOLN
INTRAMUSCULAR | Status: AC
  Filled 2021-09-25: qty 2

## 2021-09-25 MED ORDER — SODIUM CHLORIDE 0.9 % IR SOLN
Status: DC | PRN
Start: 2021-09-25 — End: 2021-09-25
  Administered 2021-09-25: 400 mL

## 2021-09-25 MED ORDER — PROPOFOL 1000 MG/100ML IV EMUL
INTRAVENOUS | Status: AC
  Filled 2021-09-25: qty 100

## 2021-09-25 MED ORDER — KETAMINE HCL 50 MG/5ML IJ SOSY
PREFILLED_SYRINGE | INTRAMUSCULAR | Status: AC
  Filled 2021-09-25: qty 5

## 2021-09-25 MED ORDER — MIDAZOLAM HCL 5 MG/5ML IJ SOLN
INTRAMUSCULAR | Status: DC | PRN
Start: 2021-09-25 — End: 2021-09-25
  Administered 2021-09-25: 2 mg via INTRAVENOUS

## 2021-09-25 MED ORDER — KETAMINE HCL 10 MG/ML IJ SOLN
INTRAMUSCULAR | Status: DC | PRN
Start: 2021-09-25 — End: 2021-09-25
  Administered 2021-09-25: 20 mg via INTRAVENOUS

## 2021-09-25 MED ORDER — PROPOFOL 500 MG/50ML IV EMUL
INTRAVENOUS | Status: DC | PRN
  Administered 2021-09-25: 75 ug/kg/min via INTRAVENOUS

## 2021-09-25 MED ORDER — CEFAZOLIN SODIUM-DEXTROSE 2-4 GM/100ML-% IV SOLN
INTRAVENOUS | Status: AC
  Filled 2021-09-25: qty 100

## 2021-09-25 MED ORDER — ONDANSETRON HCL 4 MG/2ML IJ SOLN
INTRAMUSCULAR | Status: AC
  Filled 2021-09-25: qty 2

## 2021-09-25 MED ORDER — AMISULPRIDE (ANTIEMETIC) 5 MG/2ML IV SOLN: 10.0000 mg | Freq: Once | INTRAVENOUS | Status: DC | PRN

## 2021-09-25 MED ORDER — 0.9 % SODIUM CHLORIDE (POUR BTL) OPTIME
TOPICAL | Status: DC | PRN
  Administered 2021-09-25: 500 mL

## 2021-09-25 MED ORDER — CEFAZOLIN SODIUM-DEXTROSE 1-4 GM/50ML-% IV SOLN
INTRAVENOUS | Status: AC
  Filled 2021-09-25: qty 50

## 2021-09-25 MED ORDER — GLYCOPYRROLATE 0.2 MG/ML IJ SOLN
INTRAMUSCULAR | Status: DC | PRN
Start: 2021-09-25 — End: 2021-09-25
  Administered 2021-09-25: .2 mg via INTRAVENOUS

## 2021-09-25 MED ORDER — FENTANYL CITRATE (PF) 100 MCG/2ML IJ SOLN
100.0000 ug | Freq: Once | INTRAMUSCULAR | Status: AC
  Administered 2021-09-25: 100 ug via INTRAVENOUS

## 2021-09-25 MED ORDER — ONDANSETRON HCL 4 MG/2ML IJ SOLN: 4.0000 mg | Freq: Once | INTRAMUSCULAR | Status: DC | PRN

## 2021-09-25 MED ORDER — MIDAZOLAM HCL 2 MG/2ML IJ SOLN
2.0000 mg | Freq: Once | INTRAMUSCULAR | Status: AC
  Administered 2021-09-25: 2 mg via INTRAVENOUS

## 2021-09-25 MED ORDER — LACTATED RINGERS IV SOLN: INTRAVENOUS | Status: DC

## 2021-09-25 MED ORDER — ROPIVACAINE HCL 5 MG/ML IJ SOLN
INTRAMUSCULAR | Status: DC | PRN
  Administered 2021-09-25: 20 mL via PERINEURAL

## 2021-09-25 MED ORDER — OXYCODONE HCL 5 MG PO TABS: 5.0000 mg | ORAL_TABLET | Freq: Once | ORAL | Status: DC | PRN

## 2021-09-25 MED ORDER — OXYCODONE-ACETAMINOPHEN 5-325 MG PO TABS: 1.0000 | ORAL_TABLET | Freq: Four times a day (QID) | ORAL | 0 refills | Status: AC | PRN

## 2021-09-25 MED ORDER — OXYCODONE HCL 5 MG/5ML PO SOLN: 5.0000 mg | Freq: Once | ORAL | Status: DC | PRN

## 2021-09-25 MED ORDER — ONDANSETRON HCL 4 MG/2ML IJ SOLN
INTRAMUSCULAR | Status: DC | PRN
  Administered 2021-09-25: 4 mg via INTRAVENOUS

## 2021-09-25 MED ORDER — FENTANYL CITRATE (PF) 100 MCG/2ML IJ SOLN
INTRAMUSCULAR | Status: AC
  Filled 2021-09-25: qty 2

## 2021-09-25 MED ORDER — CEFAZOLIN IN SODIUM CHLORIDE 3-0.9 GM/100ML-% IV SOLN
3.0000 g | INTRAVENOUS | Status: AC
  Administered 2021-09-25: 3 g via INTRAVENOUS

## 2021-09-25 MED ORDER — DEXAMETHASONE SODIUM PHOSPHATE 10 MG/ML IJ SOLN
INTRAMUSCULAR | Status: DC | PRN
Start: 2021-09-25 — End: 2021-09-25
  Administered 2021-09-25: 10 mg

## 2021-09-25 MED ORDER — KETOROLAC TROMETHAMINE 30 MG/ML IJ SOLN
INTRAMUSCULAR | Status: DC | PRN
  Administered 2021-09-25: 30 mg via INTRAVENOUS

## 2021-09-25 SURGICAL SUPPLY — 57 items
APL PRP STRL LF DISP 70% ISPRP (MISCELLANEOUS) ×1
BLADE SURG 15 STRL LF DISP TIS (BLADE) ×1 IMPLANT
BLADE SURG 15 STRL SS (BLADE) ×2
BNDG CMPR 9X4 STRL LF SNTH (GAUZE/BANDAGES/DRESSINGS) ×1
BNDG ELASTIC 3X5.8 VLCR STR LF (GAUZE/BANDAGES/DRESSINGS) ×1 IMPLANT
BNDG ELASTIC 4X5.8 VLCR STR LF (GAUZE/BANDAGES/DRESSINGS) ×1 IMPLANT
BNDG ESMARK 4X9 LF (GAUZE/BANDAGES/DRESSINGS) ×1 IMPLANT
BNDG GAUZE ELAST 4 BULKY (GAUZE/BANDAGES/DRESSINGS) ×1 IMPLANT
CHLORAPREP W/TINT 26 (MISCELLANEOUS) ×2 IMPLANT
COVER BACK TABLE 60X90IN (DRAPES) ×2 IMPLANT
COVER MAYO STAND STRL (DRAPES) ×1 IMPLANT
CUFF TOURN SGL QUICK 18X4 (TOURNIQUET CUFF) ×1 IMPLANT
DRAPE EXTREMITY T 121X128X90 (DISPOSABLE) ×2 IMPLANT
DRAPE INCISE IOBAN 66X45 STRL (DRAPES) IMPLANT
DRAPE SHEET LG 3/4 BI-LAMINATE (DRAPES) ×2 IMPLANT
DRSG EMULSION OIL 3X3 NADH (GAUZE/BANDAGES/DRESSINGS) ×2 IMPLANT
GAUZE SPONGE 4X4 12PLY STRL (GAUZE/BANDAGES/DRESSINGS) ×2 IMPLANT
GAUZE SPONGE 4X4 12PLY STRL LF (GAUZE/BANDAGES/DRESSINGS) ×1 IMPLANT
GLOVE SURG UNDER POLY LF SZ7.5 (GLOVE) ×4 IMPLANT
GOWN STRL REUS W/ TWL LRG LVL3 (GOWN DISPOSABLE) ×1 IMPLANT
GOWN STRL REUS W/TWL LRG LVL3 (GOWN DISPOSABLE) ×4 IMPLANT
IV NS 1000ML (IV SOLUTION) ×2
IV NS 1000ML BAXH (IV SOLUTION) IMPLANT
IV NS IRRIG 3000ML ARTHROMATIC (IV SOLUTION) ×1 IMPLANT
IV SET EXT 30 76VOL 4 MALE LL (IV SETS) ×1 IMPLANT
KIT TURNOVER CYSTO (KITS) ×2 IMPLANT
MANIFOLD NEPTUNE II (INSTRUMENTS) ×1 IMPLANT
NEEDLE HYPO 22GX1.5 SAFETY (NEEDLE) ×2 IMPLANT
PACK BASIN DAY SURGERY FS (CUSTOM PROCEDURE TRAY) ×2 IMPLANT
PAD CAST 3X4 CTTN HI CHSV (CAST SUPPLIES) ×1 IMPLANT
PAD CAST 4YDX4 CTTN HI CHSV (CAST SUPPLIES) IMPLANT
PADDING CAST COTTON 3X4 STRL (CAST SUPPLIES) ×2
PADDING CAST COTTON 4X4 STRL (CAST SUPPLIES) ×2
PROBE BIPOLAR ARTHRO 85MM 30D (MISCELLANEOUS) ×1 IMPLANT
SET SM JOINT TUBING/CANN (CANNULA) ×1 IMPLANT
SHAVER DISSECTOR 3.0 (BURR) ×1 IMPLANT
SHAVER SABRE 2.0 (BURR) ×1 IMPLANT
SLING ARM FOAM STRAP XLG (SOFTGOODS) ×1 IMPLANT
SPLINT PLASTER CAST XFAST 3X15 (CAST SUPPLIES) IMPLANT
SPLINT PLASTER XTRA FASTSET 3X (CAST SUPPLIES) ×1
STRIP CLOSURE SKIN 1/2X4 (GAUZE/BANDAGES/DRESSINGS) IMPLANT
SUCTION FRAZIER HANDLE 10FR (MISCELLANEOUS)
SUCTION TUBE FRAZIER 10FR DISP (MISCELLANEOUS) IMPLANT
SUT ETHILON 5 0 PS 2 18 (SUTURE) ×2 IMPLANT
SUT FIBERWIRE 2-0 18 17.9 3/8 (SUTURE)
SUT PROLENE 4 0 PS 2 18 (SUTURE) IMPLANT
SUT VIC AB 3-0 FS2 27 (SUTURE) IMPLANT
SUTURE FIBERWR 2-0 18 17.9 3/8 (SUTURE) IMPLANT
SYR 10ML LL (SYRINGE) ×2 IMPLANT
SYR BULB EAR ULCER 3OZ GRN STR (SYRINGE) ×2 IMPLANT
TOWEL OR 17X26 10 PK STRL BLUE (TOWEL DISPOSABLE) ×2 IMPLANT
TRAP DIGIT (INSTRUMENTS) ×1 IMPLANT
TRAP FINGER LRG (INSTRUMENTS) ×1 IMPLANT
TUBE CONNECTING 12X1/4 (SUCTIONS) ×2 IMPLANT
UNDERPAD 30X36 HEAVY ABSORB (UNDERPADS AND DIAPERS) ×2 IMPLANT
WAND 1.5 MICROBLATOR (SURGICAL WAND) ×1 IMPLANT
WATER STERILE IRR 1000ML POUR (IV SOLUTION) ×1 IMPLANT

## 2021-09-25 NOTE — Anesthesia Postprocedure Evaluation (Signed)
Anesthesia Post Note  Patient: Chad Nichols  Procedure(s) Performed: Right wrist scope TFCC debridement (Right: Wrist)     Patient location during evaluation: PACU Anesthesia Type: Regional Level of consciousness: awake and alert Pain management: pain level controlled Vital Signs Assessment: post-procedure vital signs reviewed and stable Respiratory status: spontaneous breathing, nonlabored ventilation and respiratory function stable Cardiovascular status: blood pressure returned to baseline and stable Postop Assessment: no apparent nausea or vomiting Anesthetic complications: no   No notable events documented.  Last Vitals:  Vitals:   09/25/21 1030 09/25/21 1057  BP: (!) 133/92 119/89  Pulse: (!) 59 (!) 58  Resp: 13 16  Temp:  36.7 C  SpO2: 93% 94%    Last Pain:  Vitals:   09/25/21 1057  TempSrc: Oral  PainSc: 0-No pain                 Lucretia Kern

## 2021-09-25 NOTE — Transfer of Care (Signed)
Immediate Anesthesia Transfer of Care Note  Patient: Chad Nichols  Procedure(s) Performed: Right wrist scope TFCC debridement (Right: Wrist)  Patient Location: PACU  Anesthesia Type:MAC  Level of Consciousness: awake, alert  and oriented  Airway & Oxygen Therapy: Patient Spontanous Breathing and Patient connected to nasal cannula oxygen  Post-op Assessment: Report given to RN and Post -op Vital signs reviewed and stable  Post vital signs: Reviewed and stable  Last Vitals:  Vitals Value Taken Time  BP 134/102 09/25/21 1000  Temp    Pulse 78 09/25/21 1001  Resp 15 09/25/21 1001  SpO2 96 % 09/25/21 1001  Vitals shown include unvalidated device data.  Last Pain:  Vitals:   09/25/21 0658  TempSrc: Oral  PainSc: 0-No pain      Patients Stated Pain Goal: 5 (68/03/21 2248)  Complications: No notable events documented.

## 2021-09-25 NOTE — Anesthesia Preprocedure Evaluation (Addendum)
Anesthesia Evaluation  Patient identified by MRN, date of birth, ID band Patient awake    Reviewed: Allergy & Precautions, NPO status , Patient's Chart, lab work & pertinent test results  History of Anesthesia Complications Negative for: history of anesthetic complications  Airway Mallampati: III  TM Distance: >3 FB Neck ROM: Full    Dental  (+) Teeth Intact   Pulmonary neg pulmonary ROS, former smoker,    Pulmonary exam normal        Cardiovascular negative cardio ROS Normal cardiovascular exam     Neuro/Psych negative neurological ROS     GI/Hepatic Neg liver ROS, GERD  ,  Endo/Other  negative endocrine ROS  Renal/GU negative Renal ROS  negative genitourinary   Musculoskeletal negative musculoskeletal ROS (+)   Abdominal   Peds  Hematology negative hematology ROS (+)   Anesthesia Other Findings  Right wrist TRIANGLE FIBRO CARTILAGE COMPLEX  tear  Reproductive/Obstetrics                            Anesthesia Physical Anesthesia Plan  ASA: 2  Anesthesia Plan: MAC and Regional   Post-op Pain Management: Regional block*   Induction: Intravenous  PONV Risk Score and Plan: 1 and Propofol infusion, TIVA and Treatment may vary due to age or medical condition  Airway Management Planned: Natural Airway, Nasal Cannula and Simple Face Mask  Additional Equipment: None  Intra-op Plan:   Post-operative Plan:   Informed Consent: I have reviewed the patients History and Physical, chart, labs and discussed the procedure including the risks, benefits and alternatives for the proposed anesthesia with the patient or authorized representative who has indicated his/her understanding and acceptance.       Plan Discussed with:   Anesthesia Plan Comments:         Anesthesia Quick Evaluation

## 2021-09-25 NOTE — Discharge Instructions (Addendum)
Orthopaedic Hand Surgery Discharge Instructions  WEIGHT BEARING STATUS: Non weight bearing on operative extremity  DRESSING CARE: Please keep your dressing/splint/cast clean and dry until your follow-up appointment. You may shower by placing a waterproof covering over your dressing/splint/cast. Contact your surgeon if your splint/cast gets wet. It will need to be changed to prevent skin breakdown.  PAIN CONTROL: First line medications for post operative pain control are Tylenol (acetaminophen) and Motrin (ibuprofen) if you are able to take these medications. If you have been prescribed a medication these can be taken as breakthrough pain medications. Please note that some narcotic pain medication has acetaminophen added and you should never consume more than 4,000mg  of acetaminophen in 24-hour period. Please note that if you are given Toradol (ketorolac) you should not take similar medications such as ibuprofen or naproxen.  DISCHARGE MEDICATIONS: If you have been prescribed medication it was sent electronically to your pharmacy. No changes have been made to your home medications.  ICE/ELEVATION: Ice and elevate your injured extremity as needed. Avoid direct contact of ice with skin.   BANDAGE FEELS TOO TIGHT: If your bandage feels too tight, first make sure you are elevating your fingers as much as possible. The outer layer of the bandage can be unwrapped and reapplied more loosely. If no improvement, you may carefully cut the inner layer longitudinally until the pressure has resolved and then rewrap the outer layer. If you are not comfortable with these instructions, please call the office and the bandage can be changed for you.   FOLLOW UP: You will be called after surgery with an appointment date and time, however if you have not received a phone call within 3 days, please call during regular office hours at [phone] to schedule a post operative appointment.  Please Seek Medical Attention  if: Call MD for: pain or pressure in chest, jaw, arm, back, neck  Call MD for: temperature greater than 101 F for more than 24 hrs Call MD for: difficulty breathing Call MD for: incision redness, bleeding, drainage  Call MD for: palpitations or feeling that the heart is racing  Call MD for: increased swelling in arm, leg, ankle, or abdomen  Call MD for: lightheadedness, dizziness, fainting Call 911 or go to ER for any medical emergency if you are not able to get in touch with your doctor   J. Standley Dakins, MD Orthopaedic Hand Surgeon [facility] Office number: [phone] [address], [address] [place], [place] [address]    Post Anesthesia Home Care Instructions  Activity: Get plenty of rest for the remainder of the day. A responsible individual must stay with you for 24 hours following the procedure.  For the next 24 hours, DO NOT: -Drive a car -Advertising copywriter -Drink alcoholic beverages -Take any medication unless instructed by your physician -Make any legal decisions or sign important papers.  Meals: Start with liquid foods such as gelatin or soup. Progress to regular foods as tolerated. Avoid greasy, spicy, heavy foods. If nausea and/or vomiting occur, drink only clear liquids until the nausea and/or vomiting subsides. Call your physician if vomiting continues.  Special Instructions/Symptoms: Your throat may feel dry or sore from the anesthesia or the breathing tube placed in your throat during surgery. If this causes discomfort, gargle with warm salt water. The discomfort should disappear within 24 hours.  Regional Anesthesia Blocks  1. Numbness or the inability to move the "blocked" extremity may last from 3-48 hours after placement. The length of time depends on the medication  injected and your individual response to the medication. If the numbness is not going away after 48 hours, call your surgeon.  2. The extremity that is blocked will need to be protected until  the numbness is gone and the  Strength has returned. Because you cannot feel it, you will need to take extra care to avoid injury. Because it may be weak, you may have difficulty moving it or using it. You may not know what position it is in without looking at it while the block is in effect.  3. For blocks in the legs and feet, returning to weight bearing and walking needs to be done carefully. You will need to wait until the numbness is entirely gone and the strength has returned. You should be able to move your leg and foot normally before you try and bear weight or walk. You will need someone to be with you when you first try to ensure you do not fall and possibly risk injury.  4. Bruising and tenderness at the needle site are common side effects and will resolve in a few days.  5. Persistent numbness or new problems with movement should be communicated to the surgeon or the [facility] [phone] [facility] [phone]).       Do not take any nonsteroidal anti inflammatories until after 3:45 pm today.

## 2021-09-25 NOTE — Interval H&P Note (Signed)
History and Physical Interval Note:  09/25/2021 8:31 AM  Chad Nichols  has presented today for surgery, with the diagnosis of Right wrist TRIANGLE FIBRO CARTILAGE COMPLEX  tear.  The various methods of treatment have been discussed with the patient and family. After consideration of risks, benefits and other options for treatment, the patient has consented to  Procedure(s): Right wrist scope TFCC debridement, possible repair (Right) as a surgical intervention.  The patient's history has been reviewed, patient examined, no change in status, stable for surgery.  I have reviewed the patient's chart and labs.  Questions were answered to the patient's satisfaction.     [name]

## 2021-09-25 NOTE — Op Note (Signed)
OPERATIVE NOTE  DATE OF PROCEDURE: 09/25/2021  SURGEONS:  Primary: Orene Desanctis, MD  PREOPERATIVE DIAGNOSIS: Right wrist TRIANGULAR FIBRO CARTILAGE COMPLEX  tear  POSTOPERATIVE DIAGNOSIS: Same  NAME OF PROCEDURE:   Right wrist diagnostic arthroscopy with TFCC debridement  ANESTHESIA: Regional Block + MAC  SKIN PREPARATION: Hibiclens  ESTIMATED BLOOD LOSS: Minimal  IMPLANTS: None  INDICATIONS:  Noha is a 41 y.o. male who has the above preoperative diagnosis. The patient has decided to proceed with surgical intervention.  Risks, benefits and alternatives of operative management were discussed including, but not limited to, risks of anesthesia complications, infection, pain, persistent symptoms, stiffness, need for future surgery.  The patient understands, agrees and elects to proceed with surgery.    DESCRIPTION OF PROCEDURE: The patient was met in the pre-operative area and their identity was verified.  The operative location and laterality was also verified and marked.  The patient was brought to the OR and was placed supine on the table.  After repeat patient identification with the operative team anesthesia was provided and the patient was prepped and draped in the usual sterile fashion.  A final timeout was performed verifying the correction patient, procedure, location and laterality.  Preoperative antibiotics were provided and then the right upper extremity was elevated and exsanguinated with an Esmarch and the tourniquet was inflated to 250 mmHg.  A diagnostic wrist arthroscopy was performed by setting up the wrist in the traction tower with approximately 10 to 12 pounds of traction via finger traps in the vertical position.  A dorsal 3,4 portal was established and the arthroscopy camera was placed within the radiocarpal joint.  A diagnostic arthroscopy was performed with the following arthroscopic findings: intact RSC, LRL, SRL, SL ligaments, pristine cartilage surfaces, TFCC with  volar radial tear and synovitis in prestyloid recess.  A 6R portal was established just radial to the extensor carpi ulnaris and a probe was placed within the joint.  The triangular fibrocartilage complex was identified and there was a radial tear and prestyloid recess synovitis with fraying along the periphery, negative hook test, trampoline sign, suction test indicating intact foveal insertion of tfcc. A mechanical shaver was utilized for debridement back to a stable border. There was no DRUJ instability. The wound was thoroughly irrigated and incisions were closed in standard fashion with 5-0 nylon sutures in simple fashion. A sterile soft bandage was placed followed by a short arm splint for patient comfort. The tourniquet was deflated and the fingers were pink and warm and well perfused. The patient was brought to PACU for recovery in stable condition.    Matt Holmes, MD

## 2021-09-25 NOTE — Anesthesia Procedure Notes (Signed)
Anesthesia Regional Block: Supraclavicular block   Pre-Anesthetic Checklist: , timeout performed,  Correct Patient, Correct Site, Correct Laterality,  Correct Procedure, Correct Position, site marked,  Risks and benefits discussed,  Surgical consent,  Pre-op evaluation,  At surgeon's request and post-op pain management  Laterality: Right  Prep: chloraprep       Needles:  Injection technique: Single-shot  Needle Type: Echogenic Stimulator Needle     Needle Length: 10cm  Needle Gauge: 20     Additional Needles:   Procedures:,,,, ultrasound used (permanent image in chart),,    Narrative:  Start time: 09/25/2021 7:58 AM End time: 09/25/2021 8:02 AM Injection made incrementally with aspirations every 5 mL.  Performed by: Personally  Anesthesiologist: Lucretia Kern, MD  Additional Notes: Standard monitors applied. Skin prepped. Good needle visualization with ultrasound. Injection made in 5cc increments with no resistance to injection. Patient tolerated the procedure well.

## 2021-09-25 NOTE — Progress Notes (Signed)
Assisted Dr. Witman with right, ultrasound guided, supraclavicular block. Side rails up, monitors on throughout procedure. See vital signs in flow sheet. Tolerated Procedure well. 

## 2021-09-26 ENCOUNTER — Encounter (HOSPITAL_BASED_OUTPATIENT_CLINIC_OR_DEPARTMENT_OTHER): Payer: Self-pay | Admitting: Orthopedic Surgery

## 2021-09-29 ENCOUNTER — Encounter (HOSPITAL_BASED_OUTPATIENT_CLINIC_OR_DEPARTMENT_OTHER): Payer: Self-pay | Admitting: Orthopedic Surgery

## 2021-09-29 NOTE — Addendum Note (Signed)
Addendum  created 09/29/21 0706 by Bufford Spikes, CRNA   Charge Capture section accepted

## 2021-09-29 NOTE — Addendum Note (Signed)
Addendum  created 09/29/21 0720 by Marny Lowenstein, CRNA   Charge Capture section accepted

## 2022-02-27 ENCOUNTER — Other Ambulatory Visit: Payer: Self-pay

## 2022-02-27 ENCOUNTER — Emergency Department (HOSPITAL_BASED_OUTPATIENT_CLINIC_OR_DEPARTMENT_OTHER)
Admission: EM | Admit: 2022-02-27 | Discharge: 2022-02-27 | Disposition: A | Discharge status: Home / Self Care | Payer: BC Managed Care – PPO | Attending: Emergency Medicine | Admitting: Emergency Medicine

## 2022-02-27 ENCOUNTER — Encounter (HOSPITAL_BASED_OUTPATIENT_CLINIC_OR_DEPARTMENT_OTHER): Payer: Self-pay

## 2022-02-27 ENCOUNTER — Emergency Department (HOSPITAL_BASED_OUTPATIENT_CLINIC_OR_DEPARTMENT_OTHER): Payer: BC Managed Care – PPO

## 2022-02-27 DIAGNOSIS — M545 Low back pain, unspecified: Secondary | ICD-10-CM | POA: Diagnosis present

## 2022-02-27 DIAGNOSIS — Z8616 Personal history of COVID-19: Secondary | ICD-10-CM | POA: Diagnosis not present

## 2022-02-27 DIAGNOSIS — M5442 Lumbago with sciatica, left side: Secondary | ICD-10-CM | POA: Diagnosis not present

## 2022-02-27 DIAGNOSIS — M5432 Sciatica, left side: Secondary | ICD-10-CM

## 2022-02-27 MED ORDER — DICLOFENAC SODIUM 1 % EX GEL: 2.0000 g | Freq: Four times a day (QID) | CUTANEOUS | 0 refills | Status: AC | PRN

## 2022-02-27 MED ORDER — DEXAMETHASONE SODIUM PHOSPHATE 10 MG/ML IJ SOLN
10.0000 mg | Freq: Once | INTRAMUSCULAR | Status: AC
  Administered 2022-02-27: 10 mg via INTRAMUSCULAR
  Filled 2022-02-27: qty 1

## 2022-02-27 MED ORDER — KETOROLAC TROMETHAMINE 30 MG/ML IJ SOLN
30.0000 mg | Freq: Once | INTRAMUSCULAR | Status: AC
  Administered 2022-02-27: 30 mg via INTRAMUSCULAR
  Filled 2022-02-27: qty 1

## 2022-02-27 MED ORDER — MELOXICAM 7.5 MG PO TABS: 7.5000 mg | ORAL_TABLET | Freq: Every day | ORAL | 0 refills | Status: AC

## 2022-02-27 MED ORDER — METHOCARBAMOL 500 MG PO TABS: 500.0000 mg | ORAL_TABLET | Freq: Three times a day (TID) | ORAL | 0 refills | Status: DC | PRN

## 2022-02-27 NOTE — ED Provider Notes (Signed)
Emergency Department Provider Note   I have reviewed the triage vital signs and the nursing notes.   HISTORY  Chief Complaint Back Pain and Leg Pain   HPI Chad Nichols is a 41 y.o. male with past history of lumbar radiculopathy presents to the emergency department for evaluation of lower back pain rating down the left leg.  Symptoms been ongoing now for 2 weeks.  He was at physical therapy and bent over to touch his toes as part of an exercise and he fell twinge of pain.  Initially thought was a pulled muscle but symptoms have continued and worsening.  Has been taking NSAIDs and applying ice/heat along with other topical medicines with little relief.  He feels pain runs into the left lateral thigh.  No numbness or weakness in the legs.  No bowel or bladder incontinence.  No urinary retention.  No fevers.  No groin numbness.    Past Medical History:  Diagnosis Date   Bulging lumbar disc    Complex tear of triangular fibrocartilage of right wrist    DDD (degenerative disc disease), lumbosacral    GERD (gastroesophageal reflux disease)    no meds   History of COVID-19 08/2020   per pt mild symptoms that resolved    Review of Systems  Constitutional: No fever/chills Eyes: No visual changes. ENT: No sore throat. Cardiovascular: Denies chest pain. Respiratory: Denies shortness of breath. Gastrointestinal: No abdominal pain.  No nausea, no vomiting.  No diarrhea.  No constipation. Genitourinary: Negative for dysuria. Musculoskeletal: Positive for back pain. Skin: Negative for rash. Neurological: Negative for headaches, focal weakness or numbness.   ____________________________________________   PHYSICAL EXAM:  VITAL SIGNS: ED Triage Vitals  Enc Vitals Group     BP 02/27/22 1327 123/79     Pulse Rate 02/27/22 1327 81     Resp 02/27/22 1327 19     Temp 02/27/22 1327 98 F (36.7 C)     Temp Source 02/27/22 1327 Oral     SpO2 02/27/22 1327 97 %     Weight  02/27/22 1330 266 lb (120.7 kg)     Height 02/27/22 1330 6\' 8"  (2.032 m)   Constitutional: Alert and oriented. Well appearing and in no acute distress. Eyes: Conjunctivae are normal.  Head: Atraumatic. Nose: No congestion/rhinnorhea. Mouth/Throat: Mucous membranes are moist.  Neck: No stridor.  Cardiovascular: Normal rate, regular rhythm. Good peripheral circulation. Grossly normal heart sounds.   Respiratory: Normal respiratory effort.  No retractions. Lungs CTAB. Gastrointestinal: Soft and nontender. No distention.  Musculoskeletal: No lower extremity tenderness nor edema. No gross deformities of extremities.  Tenderness to palpation along the mid lumbar spine both midline and paraspinal musculature.  No bony tenderness or deformity to the left leg.  Neurologic:  Normal speech and language. No gross focal neurologic deficits are appreciated.  Normal strength and sensation bilateral lower extremities with 2+ patellar reflexes bilaterally.  Skin:  Skin is warm, dry and intact. No rash noted.  ____________________________________________  RADIOLOGY  DG Lumbar Spine Complete  Result Date: 02/27/2022 CLINICAL DATA:  Low back pain for several weeks EXAM: LUMBAR SPINE - COMPLETE 4+ VIEW COMPARISON:  None Available. FINDINGS: Normal alignment of lumbar vertebral bodies. No loss of vertebral body height or disc height. No pars fracture. No subluxation. Mild endplate spurring along the superior endplate of L5. Spina bifida occulta at L1. IMPRESSION: No acute findings lumbar spine. Electronically Signed   By: 03/01/2022 M.D.   On: 02/27/2022 15:48  ____________________________________________   PROCEDURES  Procedure(s) performed:   Procedures  None  ____________________________________________   INITIAL IMPRESSION / ASSESSMENT AND PLAN / ED COURSE  Pertinent labs & imaging results that were available during my care of the patient were reviewed by me and considered in my medical  decision making (see chart for details).   This patient is Presenting for Evaluation of back pain, which does require a range of treatment options, and is a complaint that involves a high risk of morbidity and mortality.  The Differential Diagnoses includes but is not exclusive to musculoskeletal back pain, renal colic, urinary tract infection, pyelonephritis, intra-abdominal causes of back pain, aortic aneurysm or dissection, cauda equina syndrome, sciatica, lumbar disc disease, thoracic disc disease, etc.   Critical Interventions-    Medications  ketorolac (TORADOL) 30 MG/ML injection 30 mg (30 mg Intramuscular Given 02/27/22 1554)  dexamethasone (DECADRON) injection 10 mg (10 mg Intramuscular Given 02/27/22 1555)    Reassessment after intervention: Symptoms improved.    I decided to review pertinent External Data, and in summary with prior history of radicular pain lumbar spine followed by Dr. Shon Baton.   Radiologic Tests Ordered, included lumbar spine x-ray. I independently interpreted the images and agree with radiology interpretation.   Social Determinants of Health Risk No IVDA.  Medical Decision Making: Summary:  Patient presents emergency department with uncomplicated radicular pain lumbar spine region.  No red flag signs or symptoms to prompt advanced imaging of the spine MRI.  Do plan for plain films of the lumbar spine given 2 weeks of persistent and worsening symptoms and will treat with steroid and Toradol.  Plan to send home with muscle relaxer.   Reevaluation with update and discussion with patient. Plan for symptom mgmt and ortho follow up with his established provider. Contact information given.   Disposition: discharge  ____________________________________________  FINAL CLINICAL IMPRESSION(S) / ED DIAGNOSES  Final diagnoses:  Sciatica of left side     NEW OUTPATIENT MEDICATIONS STARTED DURING THIS VISIT:  New Prescriptions   DICLOFENAC SODIUM (VOLTAREN) 1 %  GEL    Apply 2 g topically 4 (four) times daily as needed.   MELOXICAM (MOBIC) 7.5 MG TABLET    Take 1 tablet (7.5 mg total) by mouth daily for 7 days.   METHOCARBAMOL (ROBAXIN) 500 MG TABLET    Take 1 tablet (500 mg total) by mouth every 8 (eight) hours as needed.    Note:  This document was prepared using Dragon voice recognition software and may include unintentional dictation errors.  Alona Bene, MD, [facility] Emergency Medicine    Dolorez Jeffrey, Arlyss Repress, MD 02/27/22 [phone]

## 2022-02-27 NOTE — ED Notes (Signed)
Provider at bedside

## 2022-02-27 NOTE — Discharge Instructions (Signed)
You have been seen in the Emergency Department (ED)  today for back pain.  Your workup and exam have not shown any acute abnormalities and you are likely suffering from muscle strain or possible problems with your discs, but there is no treatment that will fix your symptoms at this time.    I have called in a prescription for muscle relaxer and meloxicam. The Meloxicam will take the place of Motrin/Ibuprophen but you can still take Tylenol.   Please follow up with your doctor as soon as possible regarding today's ED visit and your back pain.  Return to the ED for worsening back pain, fever, weakness or numbness of either leg, or if you develop either (1) an inability to urinate or have bowel movements, or (2) loss of your ability to control your bathroom functions (if you start having "accidents"), or if you develop other new symptoms that concern you.

## 2022-02-27 NOTE — ED Triage Notes (Signed)
Patient c/o lower middle back pain and left leg pain - believes he over stretched it in physical therapy.

## 2022-03-15 ENCOUNTER — Emergency Department (HOSPITAL_BASED_OUTPATIENT_CLINIC_OR_DEPARTMENT_OTHER): Payer: BC Managed Care – PPO

## 2022-03-15 ENCOUNTER — Emergency Department (HOSPITAL_BASED_OUTPATIENT_CLINIC_OR_DEPARTMENT_OTHER)
Admission: EM | Admit: 2022-03-15 | Discharge: 2022-03-15 | Disposition: A | Discharge status: Home / Self Care | Payer: BC Managed Care – PPO | Attending: Emergency Medicine | Admitting: Emergency Medicine

## 2022-03-15 ENCOUNTER — Other Ambulatory Visit: Payer: Self-pay

## 2022-03-15 ENCOUNTER — Encounter (HOSPITAL_BASED_OUTPATIENT_CLINIC_OR_DEPARTMENT_OTHER): Payer: Self-pay | Admitting: Emergency Medicine

## 2022-03-15 DIAGNOSIS — S63501A Unspecified sprain of right wrist, initial encounter: Secondary | ICD-10-CM | POA: Insufficient documentation

## 2022-03-15 DIAGNOSIS — Y92410 Unspecified street and highway as the place of occurrence of the external cause: Secondary | ICD-10-CM | POA: Insufficient documentation

## 2022-03-15 DIAGNOSIS — S39012A Strain of muscle, fascia and tendon of lower back, initial encounter: Secondary | ICD-10-CM | POA: Diagnosis not present

## 2022-03-15 DIAGNOSIS — S161XXA Strain of muscle, fascia and tendon at neck level, initial encounter: Secondary | ICD-10-CM

## 2022-03-15 DIAGNOSIS — R519 Headache, unspecified: Secondary | ICD-10-CM | POA: Diagnosis not present

## 2022-03-15 DIAGNOSIS — S46912A Strain of unspecified muscle, fascia and tendon at shoulder and upper arm level, left arm, initial encounter: Secondary | ICD-10-CM | POA: Diagnosis not present

## 2022-03-15 DIAGNOSIS — S3992XA Unspecified injury of lower back, initial encounter: Secondary | ICD-10-CM | POA: Diagnosis present

## 2022-03-15 DIAGNOSIS — Z79899 Other long term (current) drug therapy: Secondary | ICD-10-CM | POA: Insufficient documentation
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
## 2022-03-15 NOTE — Discharge Instructions (Signed)
Make an appointment follow-up with sports medicine regarding the right wrist sprain.  Recommend Motrin over-the-counter as needed for the strain of the back neck and left shoulder.  X-rays of those areas without any bony abnormalities.

## 2022-03-18 ENCOUNTER — Ambulatory Visit: Payer: BC Managed Care – PPO | Admitting: Family Medicine

## 2022-05-14 ENCOUNTER — Ambulatory Visit: Payer: BC Managed Care – PPO | Admitting: Podiatry

## 2022-11-18 ENCOUNTER — Encounter: Payer: Self-pay | Admitting: *Deleted

## 2022-11-22 ENCOUNTER — Encounter (HOSPITAL_BASED_OUTPATIENT_CLINIC_OR_DEPARTMENT_OTHER): Payer: Self-pay | Admitting: Emergency Medicine

## 2022-11-22 ENCOUNTER — Emergency Department (HOSPITAL_BASED_OUTPATIENT_CLINIC_OR_DEPARTMENT_OTHER)
Admission: EM | Admit: 2022-11-22 | Discharge: 2022-11-22 | Disposition: A | Discharge status: Home / Self Care | Payer: BC Managed Care – PPO | Attending: Emergency Medicine | Admitting: Emergency Medicine

## 2022-11-22 ENCOUNTER — Emergency Department (HOSPITAL_BASED_OUTPATIENT_CLINIC_OR_DEPARTMENT_OTHER): Payer: BC Managed Care – PPO

## 2022-11-22 ENCOUNTER — Other Ambulatory Visit: Payer: Self-pay

## 2022-11-22 DIAGNOSIS — M25531 Pain in right wrist: Secondary | ICD-10-CM | POA: Diagnosis present

## 2022-11-22 MED ORDER — KETOROLAC TROMETHAMINE 30 MG/ML IJ SOLN
15.0000 mg | Freq: Once | INTRAMUSCULAR | Status: AC
  Administered 2022-11-22: 15 mg via INTRAMUSCULAR
  Filled 2022-11-22: qty 1

## 2022-11-22 NOTE — ED Provider Notes (Signed)
[place] [facility] Provider Note   CSN: [id number] Arrival date & time: 11/22/22  1036     History  Chief Complaint  Patient presents with   Wrist Pain    right    Chad Nichols is a 42 y.o. male.  Patient complains of pain and swelling to his right wrist.  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  This weighs about 25 pounds.  Sustained hyperextension injury of right wrist and having pain dorsally more than his baseline.  He has have issues with this wrist at baseline has had surgery last year by Dr. Yehuda Budd. This was in February 2023 and was repair of TRIANGULAR FIBRO CARTILAGE COMPLEX  tear States he had ligament damage and required bone shaving.  He does have chronic wrist pain for which he takes Tylenol and ibuprofen.  Pain is worse than his baseline he is concerned because that is shooting up his forearm but not past his elbow.  No weakness, numbness or tingling.  No fever, chills, nausea or vomiting.  Took ibuprofen this morning without relief.  States the pain is more severe and he is concerned that extends up to his mid forearm.  No other injuries.  The history is provided by the patient.  Wrist Pain Pertinent negatives include no chest pain, no abdominal pain, no headaches and no shortness of breath.       Home Medications Prior to Admission medications   Medication Sig Start Date End Date Taking? Authorizing Provider  diclofenac Sodium (VOLTAREN) 1 % GEL Apply 2 g topically 4 (four) times daily as needed. 02/27/22   Long, Arlyss Repress, MD  diphenhydramine-acetaminophen (TYLENOL PM) 25-500 MG TABS tablet Take 1 tablet by mouth at bedtime as needed.    [provider]  methocarbamol (ROBAXIN) 500 MG tablet Take 1 tablet (500 mg total) by mouth every 8 (eight) hours as needed. 02/27/22   Long, Arlyss Repress, MD  Multiple  Vitamins-Minerals (MULTIVITAMIN ADULT) CHEW Chew by mouth daily.    [provider]      Allergies    Pregabalin and No known allergies    Review of Systems   Review of Systems  Constitutional:  Negative for activity change, appetite change and fever.  HENT:  Negative for congestion and rhinorrhea.   Respiratory:  Negative for cough, chest tightness and shortness of breath.   Cardiovascular:  Negative for chest pain.  Gastrointestinal:  Negative for abdominal pain, nausea and vomiting.  Genitourinary:  Negative for dysuria and hematuria.  Musculoskeletal:  Positive for arthralgias and myalgias.  Skin:  Negative for rash.  Neurological:  Negative for dizziness, weakness and headaches.   all other systems are negative except as noted in the HPI and PMH.    Physical Exam Updated Vital Signs BP 121/84 (BP Location: Left Arm)   Pulse 74   Temp 98.3 F (36.8 C) (Oral)   Resp 18   Wt 115.2 kg   SpO2 96%   BMI 27.90 kg/m  Physical Exam Vitals and nursing note reviewed.  Constitutional:      General: He is not in acute distress.    Appearance: He is well-developed. He is not ill-appearing.  HENT:     Head: Normocephalic and atraumatic.     Mouth/Throat:     Pharynx: No oropharyngeal exudate.  Eyes:     Conjunctiva/sclera:  Conjunctivae normal.     Pupils: Pupils are equal, round, and reactive to light.  Neck:     Comments: No meningismus. Cardiovascular:     Rate and Rhythm: Normal rate and regular rhythm.     Heart sounds: Normal heart sounds. No murmur heard. Pulmonary:     Effort: Pulmonary effort is normal. No respiratory distress.     Breath sounds: Normal breath sounds.  Chest:     Chest wall: No tenderness.  Abdominal:     Palpations: Abdomen is soft.     Tenderness: There is no abdominal tenderness. There is no guarding or rebound.  Musculoskeletal:        General: Swelling and tenderness present.     Cervical back: Normal range of motion and neck  supple.     Comments: R dorsal wrist pain and swelling. Reduced ROM.  Radial pulse intact, cardinal hand movements intact.  Pain with range of motion. No snuffbox tenderness  Skin:    General: Skin is warm.  Neurological:     Mental Status: He is alert and oriented to person, place, and time.     Cranial Nerves: No cranial nerve deficit.     Motor: No abnormal muscle tone.     Coordination: Coordination normal.     Comments:  5/5 strength throughout. CN 2-12 intact.Equal grip strength.   Psychiatric:        Behavior: Behavior normal.     ED Results / Procedures / Treatments   Labs (all labs ordered are listed, but only abnormal results are displayed) Labs Reviewed - No data to display  EKG None  Radiology DG Wrist Complete Right  Result Date: 11/22/2022 CLINICAL DATA:  Wrist pain after an injury. EXAM: RIGHT WRIST - COMPLETE 3+ VIEW COMPARISON:  Right wrist radiographs dated 03/15/2022. FINDINGS: There is no evidence of fracture or dislocation. There is no evidence of arthropathy or other focal bone abnormality. Soft tissues are unremarkable. IMPRESSION: Negative. Electronically Signed   By: Romona Curls M.D.   On: 11/22/2022 11:02    Procedures Procedures    Medications Ordered in ED Medications  ketorolac (TORADOL) 30 MG/ML injection 15 mg (has no administration in time range)    ED Course/ Medical Decision Making/ A&P                             Medical Decision Making Amount and/or Complexity of Data Reviewed Labs: ordered. Decision-making details documented in ED Course. Radiology: ordered and independent interpretation performed. Decision-making details documented in ED Course. ECG/medicine tests: ordered and independent interpretation performed. Decision-making details documented in ED Course.  Risk Prescription drug management.   Right wrist pain after hyperextension injury yesterday.  Neurovascular intact.  Patient has had wrist surgery on this side  previously.  No evidence of any kind of joint swelling, erythema or redness.  X-ray obtained in triage is negative for fracture or dislocation.  Results reviewed and interpreted by me.  Will place in spica splint though patient has no significant snuffbox tenderness.  Advised to follow-up with Dr. Yehuda Budd this week.  Continue pain control at home.  Return to the ED with worsening pain, weakness, numbness or tingling or other concerns.        Final Clinical Impression(s) / ED Diagnoses Final diagnoses:  Right wrist pain    Rx / DC Orders ED Discharge Orders     None  Glynn Octave, MD 11/22/22 1124
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
## 2022-11-22 NOTE — ED Triage Notes (Signed)
Injured his right wrist at work yesterday , Hx surgery same joint . Limited and painful ROM .

## 2022-11-22 NOTE — Discharge Instructions (Signed)
Your x-ray today is negative.  Wear the splint as needed for pain and follow-up with Dr. Yehuda Budd for further evaluation this week.  Return to the ED with worsening pain, weakness, numbness, tingling or any other concerns.

## 2022-12-23 ENCOUNTER — Other Ambulatory Visit: Payer: Self-pay | Admitting: Orthopedic Surgery

## 2022-12-23 DIAGNOSIS — M25539 Pain in unspecified wrist: Secondary | ICD-10-CM

## 2023-01-11 ENCOUNTER — Ambulatory Visit
Admission: RE | Admit: 2023-01-11 | Discharge: 2023-01-11 | Disposition: A | Discharge status: Home / Self Care | Payer: BC Managed Care – PPO | Source: Ambulatory Visit | Attending: Orthopedic Surgery | Admitting: Orthopedic Surgery

## 2023-01-11 DIAGNOSIS — M25539 Pain in unspecified wrist: Secondary | ICD-10-CM

## 2023-05-08 ENCOUNTER — Other Ambulatory Visit: Payer: Self-pay

## 2023-05-08 ENCOUNTER — Encounter (HOSPITAL_BASED_OUTPATIENT_CLINIC_OR_DEPARTMENT_OTHER): Payer: Self-pay

## 2023-05-08 ENCOUNTER — Emergency Department (HOSPITAL_BASED_OUTPATIENT_CLINIC_OR_DEPARTMENT_OTHER): Payer: Commercial Managed Care - HMO

## 2023-05-08 ENCOUNTER — Emergency Department (HOSPITAL_BASED_OUTPATIENT_CLINIC_OR_DEPARTMENT_OTHER)
Admission: EM | Admit: 2023-05-08 | Discharge: 2023-05-08 | Disposition: A | Discharge status: Home / Self Care | Payer: Commercial Managed Care - HMO | Attending: Emergency Medicine | Admitting: Emergency Medicine

## 2023-05-08 DIAGNOSIS — S40012A Contusion of left shoulder, initial encounter: Secondary | ICD-10-CM | POA: Diagnosis not present

## 2023-05-08 DIAGNOSIS — Y92410 Unspecified street and highway as the place of occurrence of the external cause: Secondary | ICD-10-CM | POA: Insufficient documentation

## 2023-05-08 DIAGNOSIS — S39012A Strain of muscle, fascia and tendon of lower back, initial encounter: Secondary | ICD-10-CM | POA: Insufficient documentation

## 2023-05-08 DIAGNOSIS — M545 Low back pain, unspecified: Secondary | ICD-10-CM | POA: Diagnosis present
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
## 2023-06-05 ENCOUNTER — Encounter (HOSPITAL_BASED_OUTPATIENT_CLINIC_OR_DEPARTMENT_OTHER): Payer: Self-pay | Admitting: Emergency Medicine

## 2023-06-05 ENCOUNTER — Emergency Department (HOSPITAL_BASED_OUTPATIENT_CLINIC_OR_DEPARTMENT_OTHER)
Admission: EM | Admit: 2023-06-05 | Discharge: 2023-06-05 | Disposition: A | Discharge status: Home / Self Care | Payer: Commercial Managed Care - HMO | Attending: Emergency Medicine | Admitting: Emergency Medicine

## 2023-06-05 DIAGNOSIS — Y92410 Unspecified street and highway as the place of occurrence of the external cause: Secondary | ICD-10-CM | POA: Insufficient documentation

## 2023-06-05 DIAGNOSIS — M25512 Pain in left shoulder: Secondary | ICD-10-CM | POA: Diagnosis present

## 2023-06-05 DIAGNOSIS — L739 Follicular disorder, unspecified: Secondary | ICD-10-CM | POA: Diagnosis not present

## 2023-06-05 DIAGNOSIS — S46912D Strain of unspecified muscle, fascia and tendon at shoulder and upper arm level, left arm, subsequent encounter: Secondary | ICD-10-CM | POA: Diagnosis not present

## 2023-06-05 MED ORDER — METHOCARBAMOL 750 MG PO TABS: 750.0000 mg | ORAL_TABLET | Freq: Three times a day (TID) | ORAL | 0 refills | Status: AC | PRN

## 2023-06-05 MED ORDER — DOXYCYCLINE HYCLATE 100 MG PO CAPS: 100.0000 mg | ORAL_CAPSULE | Freq: Two times a day (BID) | ORAL | 0 refills | Status: AC

## 2023-06-05 NOTE — Discharge Instructions (Signed)
It was our pleasure to provide your ER care today - we hope that you feel better.  Warm compresses/heat to sore area on face. Take doxycycline (antibiotic) as prescribed.   For shoulder pain, take acetaminophen or ibuprofen as need for pain. You may also take robaxin as need for muscle pain/spasm - no driving when taking.  Follow up with primary care doctor in 1-2 weeks if symptoms fail to improve/resolve.  For shoulder, follow up with orthopedist in the next 2-3 weeks if symptoms fail to improve/resolve - call office to arrange appointment.  Return to ER if worse, new symptoms, fevers, new/severe pain, spreading redness/severe swelling, or other concern.

## 2023-06-05 NOTE — ED Notes (Signed)
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
## 2023-08-13 ENCOUNTER — Other Ambulatory Visit: Payer: Self-pay

## 2023-08-13 ENCOUNTER — Encounter (HOSPITAL_BASED_OUTPATIENT_CLINIC_OR_DEPARTMENT_OTHER): Payer: Self-pay

## 2023-08-13 ENCOUNTER — Emergency Department (HOSPITAL_BASED_OUTPATIENT_CLINIC_OR_DEPARTMENT_OTHER)
Admission: EM | Admit: 2023-08-13 | Discharge: 2023-08-13 | Disposition: A | Discharge status: Home / Self Care | Payer: Commercial Managed Care - HMO | Attending: Emergency Medicine | Admitting: Emergency Medicine

## 2023-08-13 DIAGNOSIS — M545 Low back pain, unspecified: Secondary | ICD-10-CM | POA: Insufficient documentation

## 2023-08-13 DIAGNOSIS — R040 Epistaxis: Secondary | ICD-10-CM | POA: Diagnosis not present

## 2023-08-13 DIAGNOSIS — M25512 Pain in left shoulder: Secondary | ICD-10-CM | POA: Diagnosis present
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
## 2023-08-13 NOTE — Discharge Instructions (Signed)
 I have given you a sling for your shoulder.  This should be used for your comfort.  You do not have to wear it all the time and in fact you should take your arm out of the sling at least 4 times a day and move it around.  The best way to stop nosebleeding is to hold direct pressure on the area for 15 minutes without stopping.  I typically will have you bend forward and then that way if it goes into your mouth you can spit it out without issue.  I would have you rub a small amount of antibiotic ointment just on the inside of the nose a couple times a day.  You can get a humidifier for your bedroom and that can help as well.  If you have an episode and it does not stop bleeding after 2 periods of 15 minutes of direct pressure then please come to the emergency department for repeat evaluation.  Your back pain is most likely due to a muscular strain.  There is been a lot of research on back pain, unfortunately the only thing that seems to really help is Tylenol  and ibuprofen .  Relative rest is also important to not lift greater than 10 pounds bending or twisting at the waist.  Please follow-up with your family physician.  The other thing that really seems to benefit patients is physical therapy which your doctor may send you for.  Please return to the emergency department for new numbness or weakness to your arms or legs. Difficulty with urinating or urinating or pooping on yourself.  Also if you cannot feel toilet paper when you wipe or get a fever.   Take 4 over the counter ibuprofen  tablets 3 times a day or 2 over-the-counter naproxen  tablets twice a day for pain. Also take tylenol  1000mg (2 extra strength) four times a day.   Stretching can also help try [email or web address]
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
